# Patient Record
Sex: Female | Born: 1980 | Race: White | Hispanic: No | Smoking: Former smoker
Health system: Southern US, Community
[De-identification: ages and names within clinical notes are randomized; demographics above are authoritative.]

## PROBLEM LIST (undated history)

## (undated) DIAGNOSIS — Z98811 Dental restoration status: Secondary | ICD-10-CM

## (undated) DIAGNOSIS — S66802A Unspecified injury of other specified muscles, fascia and tendons at wrist and hand level, left hand, initial encounter: Secondary | ICD-10-CM

## (undated) DIAGNOSIS — R0981 Nasal congestion: Secondary | ICD-10-CM

## (undated) HISTORY — PX: AUGMENTATION MAMMAPLASTY: SUR837

---
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
## 2002-12-17 HISTORY — PX: LAPAROSCOPIC LYSIS OF ADHESIONS: SHX5905

## 2004-12-17 HISTORY — PX: LAPAROSCOPIC OVARIAN CYSTECTOMY: SHX6248

## 2013-09-16 DIAGNOSIS — S66802A Unspecified injury of other specified muscles, fascia and tendons at wrist and hand level, left hand, initial encounter: Secondary | ICD-10-CM

## 2013-09-16 HISTORY — DX: Unspecified injury of other specified muscles, fascia and tendons at wrist and hand level, left hand, initial encounter: S66.802A

## 2013-09-25 ENCOUNTER — Other Ambulatory Visit: Payer: Self-pay | Admitting: Orthopedic Surgery

## 2013-09-25 ENCOUNTER — Encounter (HOSPITAL_BASED_OUTPATIENT_CLINIC_OR_DEPARTMENT_OTHER): Payer: Self-pay | Admitting: *Deleted

## 2013-09-25 DIAGNOSIS — R0981 Nasal congestion: Secondary | ICD-10-CM

## 2013-09-25 HISTORY — DX: Nasal congestion: R09.81

## 2013-09-29 ENCOUNTER — Encounter (HOSPITAL_BASED_OUTPATIENT_CLINIC_OR_DEPARTMENT_OTHER): Payer: Self-pay | Admitting: Orthopedic Surgery

## 2013-09-29 ENCOUNTER — Encounter (HOSPITAL_BASED_OUTPATIENT_CLINIC_OR_DEPARTMENT_OTHER)
Admission: RE | Disposition: A | Discharge status: Home / Self Care | Payer: Self-pay | Source: Ambulatory Visit | Attending: Orthopedic Surgery

## 2013-09-29 ENCOUNTER — Encounter (HOSPITAL_BASED_OUTPATIENT_CLINIC_OR_DEPARTMENT_OTHER): Payer: Federal, State, Local not specified - PPO | Admitting: Certified Registered Nurse Anesthetist

## 2013-09-29 ENCOUNTER — Ambulatory Visit (HOSPITAL_BASED_OUTPATIENT_CLINIC_OR_DEPARTMENT_OTHER): Payer: Federal, State, Local not specified - PPO | Admitting: Certified Registered Nurse Anesthetist

## 2013-09-29 ENCOUNTER — Ambulatory Visit (HOSPITAL_BASED_OUTPATIENT_CLINIC_OR_DEPARTMENT_OTHER)
Admission: RE | Admit: 2013-09-29 | Discharge: 2013-09-29 | Disposition: A | Discharge status: Home / Self Care | Payer: Federal, State, Local not specified - PPO | Source: Ambulatory Visit | Attending: Orthopedic Surgery | Admitting: Orthopedic Surgery

## 2013-09-29 DIAGNOSIS — Y93K1 Activity, walking an animal: Secondary | ICD-10-CM | POA: Insufficient documentation

## 2013-09-29 DIAGNOSIS — S62639A Displaced fracture of distal phalanx of unspecified finger, initial encounter for closed fracture: Secondary | ICD-10-CM | POA: Insufficient documentation

## 2013-09-29 DIAGNOSIS — IMO0002 Reserved for concepts with insufficient information to code with codable children: Secondary | ICD-10-CM | POA: Insufficient documentation

## 2013-09-29 HISTORY — PX: TENDON REPAIR: SHX5111

## 2013-09-29 HISTORY — DX: Dental restoration status: Z98.811

## 2013-09-29 HISTORY — DX: Unspecified injury of other specified muscles, fascia and tendons at wrist and hand level, left hand, initial encounter: S66.802A

## 2013-09-29 HISTORY — DX: Nasal congestion: R09.81

## 2013-09-29 SURGERY — TENDON REPAIR
Anesthesia: General | Site: Finger | Laterality: Left | Wound class: Clean

## 2013-09-29 MED ORDER — BUPIVACAINE-EPINEPHRINE PF 0.5-1:200000 % IJ SOLN
INTRAMUSCULAR | Status: DC | PRN
  Administered 2013-09-29: 30 mL

## 2013-09-29 MED ORDER — BUPIVACAINE HCL (PF) 0.25 % IJ SOLN
INTRAMUSCULAR | Status: AC
  Filled 2013-09-29: qty 30

## 2013-09-29 MED ORDER — MEPERIDINE HCL 25 MG/ML IJ SOLN: 6.2500 mg | INTRAMUSCULAR | Status: DC | PRN

## 2013-09-29 MED ORDER — DEXAMETHASONE SODIUM PHOSPHATE 10 MG/ML IJ SOLN
INTRAMUSCULAR | Status: DC | PRN
  Administered 2013-09-29: 10 mg via INTRAVENOUS

## 2013-09-29 MED ORDER — OXYCODONE HCL 5 MG PO TABS: 5.0000 mg | ORAL_TABLET | Freq: Once | ORAL | Status: DC | PRN

## 2013-09-29 MED ORDER — CHLORHEXIDINE GLUCONATE 4 % EX LIQD: 60.0000 mL | Freq: Once | CUTANEOUS | Status: DC

## 2013-09-29 MED ORDER — ONDANSETRON HCL 4 MG/2ML IJ SOLN
INTRAMUSCULAR | Status: DC | PRN
  Administered 2013-09-29: 4 mg via INTRAMUSCULAR

## 2013-09-29 MED ORDER — DEXAMETHASONE SODIUM PHOSPHATE 4 MG/ML IJ SOLN
INTRAMUSCULAR | Status: DC | PRN
  Administered 2013-09-29: 4 mg

## 2013-09-29 MED ORDER — FENTANYL CITRATE 0.05 MG/ML IJ SOLN
50.0000 ug | INTRAMUSCULAR | Status: DC | PRN
  Administered 2013-09-29: 100 ug via INTRAVENOUS

## 2013-09-29 MED ORDER — HYDROMORPHONE HCL PF 1 MG/ML IJ SOLN: 0.2500 mg | INTRAMUSCULAR | Status: DC | PRN

## 2013-09-29 MED ORDER — ONDANSETRON HCL 4 MG/2ML IJ SOLN: 4.0000 mg | Freq: Once | INTRAMUSCULAR | Status: DC | PRN

## 2013-09-29 MED ORDER — CEFAZOLIN SODIUM-DEXTROSE 2-3 GM-% IV SOLR
INTRAVENOUS | Status: AC
  Filled 2013-09-29: qty 50

## 2013-09-29 MED ORDER — LACTATED RINGERS IV SOLN
INTRAVENOUS | Status: DC
  Administered 2013-09-29 (×2): via INTRAVENOUS

## 2013-09-29 MED ORDER — MIDAZOLAM HCL 5 MG/5ML IJ SOLN
INTRAMUSCULAR | Status: DC | PRN
  Administered 2013-09-29: 1 mg via INTRAVENOUS

## 2013-09-29 MED ORDER — CEFAZOLIN SODIUM-DEXTROSE 2-3 GM-% IV SOLR
2.0000 g | INTRAVENOUS | Status: AC
  Administered 2013-09-29: 2 g via INTRAVENOUS

## 2013-09-29 MED ORDER — OXYCODONE HCL 5 MG/5ML PO SOLN: 5.0000 mg | Freq: Once | ORAL | Status: DC | PRN

## 2013-09-29 MED ORDER — FENTANYL CITRATE 0.05 MG/ML IJ SOLN
INTRAMUSCULAR | Status: AC
  Filled 2013-09-29: qty 2

## 2013-09-29 MED ORDER — MIDAZOLAM HCL 2 MG/2ML IJ SOLN
INTRAMUSCULAR | Status: AC
  Filled 2013-09-29: qty 2

## 2013-09-29 MED ORDER — MIDAZOLAM HCL 2 MG/2ML IJ SOLN
1.0000 mg | INTRAMUSCULAR | Status: DC | PRN
  Administered 2013-09-29: 2 mg via INTRAVENOUS

## 2013-09-29 MED ORDER — 0.9 % SODIUM CHLORIDE (POUR BTL) OPTIME
TOPICAL | Status: DC | PRN
  Administered 2013-09-29: 200 mL

## 2013-09-29 MED ORDER — HYDROCODONE-ACETAMINOPHEN 5-500 MG PO TABS: 1.0000 | ORAL_TABLET | Freq: Four times a day (QID) | ORAL | Status: DC | PRN

## 2013-09-29 MED ORDER — LIDOCAINE HCL (CARDIAC) 20 MG/ML IV SOLN
INTRAVENOUS | Status: DC | PRN
  Administered 2013-09-29: 30 mg via INTRAVENOUS

## 2013-09-29 MED ORDER — PROPOFOL 10 MG/ML IV BOLUS
INTRAVENOUS | Status: DC | PRN
  Administered 2013-09-29: 150 mg via INTRAVENOUS

## 2013-09-29 MED ORDER — MIDAZOLAM HCL 2 MG/ML PO SYRP: 12.0000 mg | ORAL_SOLUTION | Freq: Once | ORAL | Status: DC | PRN

## 2013-09-29 SURGICAL SUPPLY — 79 items
BAG DECANTER FOR FLEXI CONT (MISCELLANEOUS) IMPLANT
BANDAGE GAUZE ELAST BULKY 4 IN (GAUZE/BANDAGES/DRESSINGS) ×2 IMPLANT
BLADE MINI RND TIP GREEN BEAV (BLADE) IMPLANT
BLADE SURG 15 STRL LF DISP TIS (BLADE) ×1 IMPLANT
BLADE SURG 15 STRL SS (BLADE) ×1
BNDG COHESIVE 3X5 TAN STRL LF (GAUZE/BANDAGES/DRESSINGS) ×2 IMPLANT
BNDG ESMARK 4X9 LF (GAUZE/BANDAGES/DRESSINGS) ×2 IMPLANT
CHLORAPREP W/TINT 26ML (MISCELLANEOUS) ×2 IMPLANT
CLOTH BEACON ORANGE TIMEOUT ST (SAFETY) IMPLANT
CORDS BIPOLAR (ELECTRODE) ×2 IMPLANT
COTTONBALL LRG STERILE PKG (GAUZE/BANDAGES/DRESSINGS) IMPLANT
COVER MAYO STAND STRL (DRAPES) ×2 IMPLANT
COVER TABLE BACK 60X90 (DRAPES) ×2 IMPLANT
CUFF TOURNIQUET SINGLE 18IN (TOURNIQUET CUFF) ×2 IMPLANT
DECANTER SPIKE VIAL GLASS SM (MISCELLANEOUS) IMPLANT
DRAIN TLS ROUND 10FR (DRAIN) IMPLANT
DRAPE EXTREMITY T 121X128X90 (DRAPE) ×2 IMPLANT
DRAPE OEC MINIVIEW 54X84 (DRAPES) ×2 IMPLANT
DRAPE SURG 17X23 STRL (DRAPES) ×2 IMPLANT
DRSG KUZMA FLUFF (GAUZE/BANDAGES/DRESSINGS) IMPLANT
GAUZE SPONGE 4X4 16PLY XRAY LF (GAUZE/BANDAGES/DRESSINGS) IMPLANT
GAUZE XEROFORM 1X8 LF (GAUZE/BANDAGES/DRESSINGS) ×2 IMPLANT
GLOVE BIOGEL PI IND STRL 7.0 (GLOVE) ×1 IMPLANT
GLOVE BIOGEL PI IND STRL 8.5 (GLOVE) ×1 IMPLANT
GLOVE BIOGEL PI INDICATOR 7.0 (GLOVE) ×1
GLOVE BIOGEL PI INDICATOR 8.5 (GLOVE) ×1
GLOVE ECLIPSE 6.5 STRL STRAW (GLOVE) ×2 IMPLANT
GLOVE EXAM NITRILE MD LF STRL (GLOVE) ×2 IMPLANT
GLOVE SURG ORTHO 8.0 STRL STRW (GLOVE) ×2 IMPLANT
GOWN BRE IMP PREV XXLGXLNG (GOWN DISPOSABLE) ×2 IMPLANT
GOWN PREVENTION PLUS XLARGE (GOWN DISPOSABLE) ×2 IMPLANT
K-WIRE .035X4 (WIRE) IMPLANT
K-WIRE PROS .028 4 (WIRE) ×2 IMPLANT
LOOP VESSEL MAXI BLUE (MISCELLANEOUS) IMPLANT
NEEDLE 27GAX1X1/2 (NEEDLE) IMPLANT
NEEDLE HYPO 22GX1.5 SAFETY (NEEDLE) IMPLANT
NEEDLE KEITH (NEEDLE) IMPLANT
NS IRRIG 1000ML POUR BTL (IV SOLUTION) ×2 IMPLANT
PACK BASIN DAY SURGERY FS (CUSTOM PROCEDURE TRAY) ×2 IMPLANT
PAD CAST 3X4 CTTN HI CHSV (CAST SUPPLIES) ×1 IMPLANT
PADDING CAST ABS 3INX4YD NS (CAST SUPPLIES)
PADDING CAST ABS 4INX4YD NS (CAST SUPPLIES)
PADDING CAST ABS COTTON 3X4 (CAST SUPPLIES) IMPLANT
PADDING CAST ABS COTTON 4X4 ST (CAST SUPPLIES) IMPLANT
PADDING CAST COTTON 3X4 STRL (CAST SUPPLIES) ×1
SLEEVE SCD COMPRESS KNEE MED (MISCELLANEOUS) ×2 IMPLANT
SLING ARM FOAM STRAP LRG (SOFTGOODS) ×2 IMPLANT
SPLINT PLASTER CAST XFAST 3X15 (CAST SUPPLIES) ×10 IMPLANT
SPLINT PLASTER XTRA FASTSET 3X (CAST SUPPLIES) ×10
SPONGE GAUZE 4X4 12PLY (GAUZE/BANDAGES/DRESSINGS) ×2 IMPLANT
STOCKINETTE 4X48 STRL (DRAPES) ×2 IMPLANT
SUT CHROMIC 5 0 P 3 (SUTURE) IMPLANT
SUT ETHIBOND 3-0 V-5 (SUTURE) IMPLANT
SUT FIBERWIRE 2-0 18 17.9 3/8 (SUTURE)
SUT FIBERWIRE 4-0 18 TAPR NDL (SUTURE)
SUT MERSILENE 2.0 SH NDLE (SUTURE) IMPLANT
SUT MERSILENE 3 0 FS 1 (SUTURE) IMPLANT
SUT MERSILENE 4 0 P 3 (SUTURE) IMPLANT
SUT POLY BUTTON 15MM (SUTURE) ×2 IMPLANT
SUT PROLENE 2 0 SH DA (SUTURE) ×2 IMPLANT
SUT SILK 2 0 FS (SUTURE) ×2 IMPLANT
SUT SILK 4 0 PS 2 (SUTURE) IMPLANT
SUT STEEL 3 0 (SUTURE) IMPLANT
SUT STEEL 4 0 V 26 (SUTURE) IMPLANT
SUT VIC AB 3-0 PS1 18 (SUTURE)
SUT VIC AB 3-0 PS1 18XBRD (SUTURE) IMPLANT
SUT VIC AB 4-0 P-3 18XBRD (SUTURE) IMPLANT
SUT VIC AB 4-0 P2 18 (SUTURE) ×2 IMPLANT
SUT VIC AB 4-0 P3 18 (SUTURE)
SUT VICRYL 4-0 PS2 18IN ABS (SUTURE) IMPLANT
SUT VICRYL RAPID 5 0 P 3 (SUTURE) IMPLANT
SUT VICRYL RAPIDE 4/0 PS 2 (SUTURE) ×2 IMPLANT
SUTURE FIBERWR 2-0 18 17.9 3/8 (SUTURE) IMPLANT
SUTURE FIBERWR 4-0 18 TAPR NDL (SUTURE) IMPLANT
SYR BULB 3OZ (MISCELLANEOUS) ×2 IMPLANT
SYR CONTROL 10ML LL (SYRINGE) IMPLANT
TOWEL OR 17X24 6PK STRL BLUE (TOWEL DISPOSABLE) ×4 IMPLANT
TUBE FEEDING 5FR 15 INCH (TUBING) IMPLANT
UNDERPAD 30X30 INCONTINENT (UNDERPADS AND DIAPERS) ×2 IMPLANT

## 2013-09-29 NOTE — Progress Notes (Signed)
Assisted Dr. Ossey with left, ultrasound guided, supraclavicular block. Side rails up, monitors on throughout procedure. See vital signs in flow sheet. Tolerated Procedure well. 

## 2013-09-29 NOTE — H&P (Signed)
  Emily Rogers is a 32 year old right hand dominant female with an injury to her left ring finger. This occurred when the finger was caught in a dog harness on 09-12-13. She did not seek medical attention. She went to [facility] on 09-23-13 where x-rays were taken revealing a fracture of his distal phalanx volar aspect with proximal migration. She has no prior history of injuries. No history of diabetes, thyroid problems, arthritis or gout.   PAST MEDICAL HISTORY: She has no known drug allergies. She is on Mucinex D. She has had an appendectomy, laparoscopy adhesion removal, ovarian cyst and adhesion removal in 2006.  FAMILY H ISTORY: Negative.  SOCIAL HISTORY: She does not smoke. She drinks socially. She is married and an Database administrator.   REVIEW OF SYSTEMS: Positive for contacts, glasses, otherwise negative for 14 points. Emily Rogers is an 32 y.o. female.   Chief Complaint: Flexor tendon avulsion lrf HPI: see above  Past Medical History  Diagnosis Date  . Injury of flexor tendon of left hand 09/2013    ring finger  . Nasal congestion 09/25/2013  . Dental crowns present     x 3    Past Surgical History  Procedure Laterality Date  . Appendectomy  1988  . Laparoscopic lysis of adhesions  2004  . Laparoscopic ovarian cystectomy  2006    with lysis of adhesions    History reviewed. No pertinent family history. Social History:  reports that she has never smoked. She has never used smokeless tobacco. She reports that she drinks alcohol. She reports that she does not use illicit drugs.  Allergies: No Known Allergies  No prescriptions prior to admission    No results found for this or any previous visit (from the past 48 hour(s)).  No results found.   Pertinent items are noted in HPI.  Height 5\' 8"  (1.727 m), weight 127 lb (57.607 kg), last menstrual period 09/21/2013.  General appearance: alert, cooperative and appears stated age Head: Normocephalic, without  obvious abnormality, atraumatic Neck: no JVD Resp: clear to auscultation bilaterally Cardio: regular rate and rhythm, S1, S2 normal, no murmur, click, rub or gallop GI: soft, non-tender; bowel sounds normal; no masses,  no organomegaly Extremities: extremities normal, atraumatic, no cyanosis or edema Pulses: 2+ and symmetric Skin: Skin color, texture, turgor normal. No rashes or lesions Neurologic: Grossly normal Incision/Wound: na  Assessment/Plan X-rays reveal an avulsion of the volar half of the distal phalanx attachment of the flexor digitorum profundus.  We have discussed this with her and her husband. We discussed that this is a flexor tendon avulsion injury and the tendon may actually not be attached to the fracture fragment. This is going to need to be repaired as an outpatient. The pre, peri and post op course are discussed along with risks and complications. They are aware there is no guarantee with surgery, possibility of infection, recurrence, injury to arteries, nerves and tendons, incomplete relief of symptoms, loss of mobility, stiffness and dystrophy. She has elected to proceed. She is scheduled for ORIF repair flexor tendon left ring finger as an outpatient under regional anesthesia.  Emily Rogers R 09/29/2013, 10:09 AM

## 2013-09-29 NOTE — Op Note (Signed)
Dictation Number [id number]

## 2013-09-29 NOTE — Transfer of Care (Signed)
Immediate Anesthesia Transfer of Care Note  Patient: Emily Rogers  Procedure(s) Performed: Procedure(s): REPAIR FLEXOR TENDON LEFT RING FINGER (Left)  Patient Location: PACU  Anesthesia Type:GA combined with regional for post-op pain  Level of Consciousness: awake, alert , oriented and patient cooperative  Airway & Oxygen Therapy: Patient Spontanous Breathing and Patient connected to face mask oxygen  Post-op Assessment: Report given to PACU RN and Post -op Vital signs reviewed and stable  Post vital signs: Reviewed and stable  Complications: No apparent anesthesia complications

## 2013-09-29 NOTE — Anesthesia Procedure Notes (Addendum)
Anesthesia Regional Block:  Supraclavicular block  Pre-Anesthetic Checklist: ,, timeout performed, Correct Patient, Correct Site, Correct Laterality, Correct Procedure, Correct Position, site marked, Risks and benefits discussed,  Surgical consent,  Pre-op evaluation,  At surgeon's request and post-op pain management  Laterality: Left  Prep: chloraprep       Needles:   Needle Type: Echogenic Stimulator Needle     Needle Length: 5cm 5 cm Needle Gauge: 21 and 21 G    Additional Needles:  Procedures: ultrasound guided (picture in chart) and nerve stimulator Supraclavicular block  Nerve Stimulator or Paresthesia:  Response: 0.4 mA,   Additional Responses:   Narrative:  Start time: 09/29/2013 12:00 PM End time: 09/29/2013 12:10 PM Injection made incrementally with aspirations every 5 mL.  Performed by: Personally  Anesthesiologist: Arta Bruce MD  Additional Notes: Monitors applied. Patient sedated. Sterile prep and drape,hand hygiene and sterile gloves were used. Relevant anatomy identified.Needle position confirmed.Local anesthetic injected incrementally after negative aspiration. Local anesthetic spread visualized around nerve(s). Vascular puncture avoided. No complications. Image printed for medical record.The patient tolerated the procedure well.       Supraclavicular block Procedure Name: LMA Insertion Date/Time: 09/29/2013 1:16 PM Performed by: Cindee Salt R Pre-anesthesia Checklist: Patient identified, Emergency Drugs available, Suction available and Patient being monitored Patient Re-evaluated:Patient Re-evaluated prior to inductionOxygen Delivery Method: Circle System Utilized Preoxygenation: Pre-oxygenation with 100% oxygen Intubation Type: IV induction Ventilation: Mask ventilation without difficulty LMA: LMA inserted LMA Size: 4.0 Number of attempts: 1 Airway Equipment and Method: bite block Placement Confirmation: positive ETCO2 Tube secured with:  Tape Dental Injury: Teeth and Oropharynx as per pre-operative assessment

## 2013-09-29 NOTE — Progress Notes (Signed)
Pt vagaled  With IV insertion B/P and HR dropped, pt remained awake and alert Wet wash cloth applied to forehead IV fluids allowed to run in  Freely Pt states " I'm feeling better after 15 min

## 2013-09-29 NOTE — Anesthesia Preprocedure Evaluation (Addendum)
Anesthesia Evaluation  Patient identified by MRN, date of birth, ID band Patient awake    Reviewed: Allergy & Precautions, H&P , NPO status , Patient's Chart, lab work & pertinent test results  Airway Mallampati: I  TM Distance: >3 FB Neck ROM: Full    Dental   Pulmonary          Cardiovascular     Neuro/Psych    GI/Hepatic   Endo/Other    Renal/GU      Musculoskeletal   Abdominal   Peds  Hematology   Anesthesia Other Findings   Reproductive/Obstetrics                             Anesthesia Physical Anesthesia Plan  ASA: II  Anesthesia Plan: General   Post-op Pain Management:    Induction: Intravenous  Airway Management Planned: LMA  Additional Equipment:   Intra-op Plan:   Post-operative Plan: Extubation in OR  Informed Consent:   Plan Discussed with:   Anesthesia Plan Comments:         Anesthesia Quick Evaluation  

## 2013-09-29 NOTE — Brief Op Note (Signed)
09/29/2013  2:19 PM  PATIENT:  [name]  32 y.o. female  PRE-OPERATIVE DIAGNOSIS:  AVULSION FLEXOR LEFT RING FINGER  POST-OPERATIVE DIAGNOSIS:  AVULSION FLEXOR LEFT RING FINGER  PROCEDURE:  Procedure(s): REPAIR FLEXOR TENDON LEFT RING FINGER (Left)  SURGEON:  Surgeon(s) and Role:    * Nicki Reaper, MD - Primary  PHYSICIAN ASSISTANT:   ASSISTANTS: none   ANESTHESIA:   regional and general  EBL:  Total I/O In: 1500 [I.V.:1500] Out: -   BLOOD ADMINISTERED:none  DRAINS: none   LOCAL MEDICATIONS USED:  NONE  SPECIMEN:  No Specimen  DISPOSITION OF SPECIMEN:  N/A  COUNTS:  YES  TOURNIQUET:   Total Tourniquet Time Documented: Upper Arm (Left) - 47 minutes Total: Upper Arm (Left) - 47 minutes   DICTATION: .Other Dictation: Dictation Number [id number]  PLAN OF CARE: Discharge to home after PACU  PATIENT DISPOSITION:  PACU - hemodynamically stable.

## 2013-09-29 NOTE — Anesthesia Postprocedure Evaluation (Signed)
Anesthesia Post Note  Patient: Emily Rogers  Procedure(s) Performed: Procedure(s) (LRB): REPAIR FLEXOR TENDON LEFT RING FINGER (Left)  Anesthesia type: general  Patient location: PACU  Post pain: Pain level controlled  Post assessment: Patient's Cardiovascular Status Stable  Last Vitals:  Filed Vitals:   09/29/13 1445  BP: 124/83  Pulse: 80  Temp:   Resp: 16    Post vital signs: Reviewed and stable  Level of consciousness: sedated  Complications: No apparent anesthesia complications

## 2013-09-30 NOTE — Op Note (Signed)
NAMEMARYBELLE, Emily Rogers                  ACCOUNT NO.:  [id number]  MEDICAL RECORD NO.:  [id number]  LOCATION:                                 FACILITY:  PHYSICIAN:  Cindee Salt, M.D.            DATE OF BIRTH:  DATE OF PROCEDURE:  09/29/2013 DATE OF DISCHARGE:                              OPERATIVE REPORT   PREOPERATIVE DIAGNOSIS:  Avulsion profundus tendon, left ring finger.  POSTOPERATIVE DIAGNOSIS:  Avulsion profundus tendon, left ring finger.  OPERATION:  Open reduction and internal fixation distal phalanx fracture with reattachment of profundus tendon, left ring finger.  SURGEON:  Cindee Salt, M.D.  ANESTHESIA:  Supraclavicular block general.  ANESTHESIOLOGIST:  Kaylyn Layer. Michelle Piper, MD.  HISTORY:  The patient is a 32 year old female who suffered an injury to her left ring finger when it was caught in a dog leash.  X-rays reveal a fracture of the distal phalanx with a avulsion profundus tendon, inability to flex.  Circulation and sensation were intact.  She is admitted for open reduction and internal fixation with repair of flexor tendon.  Pre, peri, and postoperative course have been discussed along with risks and complications.  She is aware that there is no guarantee with surgery; possibility of infection; recurrence of injury to arteries, nerves, tendons, incomplete relief of symptoms, dystrophy.  In the preoperative area, the patient is seen, the extremity marked by both patient and surgeon.  Antibiotic given.  PROCEDURE:  The patient was brought to the operating room, where a supraclavicular block general anesthetic under the direction of Dr. Michelle Piper was performed.  She was prepped using ChloraPrep, supine in position with the left arm free.  A 3-minute dry time was allowed.  Time- out taken, confirming patient and procedure.  The limb was exsanguinated with an Esmarch bandage.  Tourniquet placed high and the arm was inflated to 250 mmHg.  A midlateral incision was made on the  ulnar aspect of the ring finger, carried across the pulp, carried down through subcutaneous tissue.  Bleeders were electrocauterized with bipolar.  The neurovascular bundle was identified.  This was traced distally.  The profundus tendon was found to be avulsed.  The periosteum had been not fully removed distally.  The bone fragment being pulled from beneath this was incised taking care to protect the neurovascular bundle both radially and ulnarly.  The area was then debrided.  A dorsal fracture was also noted.  It was decided that this would require pinning for stabilization.  A modified Bunnell suture using 2-0 Prolene was then passed.  The volar fracture was in 2-3 pieces that was comminuted and as such, would not hold a screws.  The suture was placed through the fracture fragments to allow the articular surface be realigned without regard to the exact position of the more distal fragments.  The K-wire was then placed.  This was two 8 in size.  This stabilized the articular surface.  The profundus tendon was then reinserted and tied over a button after passing the needles through the dorsal cortex out through the nail matrix and nail plate distally.  The wound was copiously irrigated  with saline.  X-rays confirmed positioning of the joint in good position.  The fracture fragments at the termination of the profundus tendon were still slightly prominent, but this was felt to be secondary to pulling the tendon into the fracture defect.  The skin was closed with interrupted 4-0 Vicryl Rapide sutures.  The pin was bent, cut short.  Sterile compressive dressing and splint applied with the fingers in a slightly flexed position.  The patient tolerated the procedure well.  On deflation of the tourniquet, she was taken to the recovery room for observation in satisfactory condition, fingers immediately turned pink.  On deflation of the tourniquet, she will be discharged home to return in 1 week  on Vicodin.          ______________________________ Cindee Salt, M.D.     GK/MEDQ  D:  09/29/2013  T:  09/30/2013  Job:  [id number]

## 2013-10-02 ENCOUNTER — Encounter (HOSPITAL_BASED_OUTPATIENT_CLINIC_OR_DEPARTMENT_OTHER): Payer: Self-pay | Admitting: Orthopedic Surgery

## 2015-01-03 ENCOUNTER — Inpatient Hospital Stay (HOSPITAL_COMMUNITY)
Admission: AD | Admit: 2015-01-03 | Payer: Federal, State, Local not specified - PPO | Source: Ambulatory Visit | Admitting: Obstetrics

## 2015-05-04 ENCOUNTER — Inpatient Hospital Stay (HOSPITAL_COMMUNITY)
Admission: AD | Admit: 2015-05-04 | Discharge: 2015-05-07 | DRG: 766 | Disposition: A | Discharge status: Home / Self Care | Payer: Federal, State, Local not specified - PPO | Source: Ambulatory Visit | Attending: Obstetrics and Gynecology | Admitting: Obstetrics and Gynecology

## 2015-05-04 ENCOUNTER — Inpatient Hospital Stay (HOSPITAL_COMMUNITY): Payer: Federal, State, Local not specified - PPO | Admitting: Anesthesiology

## 2015-05-04 ENCOUNTER — Encounter (HOSPITAL_COMMUNITY)
Admission: AD | Disposition: A | Discharge status: Home / Self Care | Payer: Self-pay | Source: Ambulatory Visit | Attending: Obstetrics and Gynecology

## 2015-05-04 ENCOUNTER — Encounter (HOSPITAL_COMMUNITY): Payer: Self-pay | Admitting: *Deleted

## 2015-05-04 DIAGNOSIS — O133 Gestational [pregnancy-induced] hypertension without significant proteinuria, third trimester: Secondary | ICD-10-CM | POA: Diagnosis present

## 2015-05-04 DIAGNOSIS — O321XX0 Maternal care for breech presentation, not applicable or unspecified: Secondary | ICD-10-CM | POA: Diagnosis present

## 2015-05-04 DIAGNOSIS — O09513 Supervision of elderly primigravida, third trimester: Secondary | ICD-10-CM | POA: Diagnosis not present

## 2015-05-04 DIAGNOSIS — Z3A38 38 weeks gestation of pregnancy: Secondary | ICD-10-CM | POA: Diagnosis present

## 2015-05-04 DIAGNOSIS — Z3403 Encounter for supervision of normal first pregnancy, third trimester: Secondary | ICD-10-CM | POA: Diagnosis present

## 2015-05-04 DIAGNOSIS — Z98891 History of uterine scar from previous surgery: Secondary | ICD-10-CM

## 2015-05-04 HISTORY — PX: TUBAL LIGATION: SHX77

## 2015-05-04 LAB — COMPREHENSIVE METABOLIC PANEL
ALBUMIN: 2.9 g/dL — AB (ref 3.5–5.0)
ALT: 24 U/L (ref 14–54)
AST: 26 U/L (ref 15–41)
Alkaline Phosphatase: 82 U/L (ref 38–126)
Anion gap: 9 (ref 5–15)
BUN: 10 mg/dL (ref 6–20)
CALCIUM: 9 mg/dL (ref 8.9–10.3)
CO2: 23 mmol/L (ref 22–32)
Chloride: 104 mmol/L (ref 101–111)
Creatinine, Ser: 0.67 mg/dL (ref 0.44–1.00)
GFR calc Af Amer: >60 mL/min (ref >60)
GFR calc non Af Amer: >60 mL/min (ref >60)
Glucose, Bld: 90 mg/dL (ref 65–99)
POTASSIUM: 3.9 mmol/L (ref 3.5–5.1)
SODIUM: 136 mmol/L (ref 135–145)
Total Bilirubin: 0.2 mg/dL — ABNORMAL LOW (ref 0.3–1.2)
Total Protein: 6.1 g/dL — ABNORMAL LOW (ref 6.5–8.1)

## 2015-05-04 LAB — PROTEIN / CREATININE RATIO, URINE
CREATININE, URINE: 186 mg/dL
Protein Creatinine Ratio: 0.26 mg/mg{Cre} — ABNORMAL HIGH (ref 0.00–0.15)
TOTAL PROTEIN, URINE: 49 mg/dL

## 2015-05-04 LAB — URINALYSIS, ROUTINE W REFLEX MICROSCOPIC
Bilirubin Urine: NEGATIVE
GLUCOSE, UA: NEGATIVE mg/dL
HGB URINE DIPSTICK: NEGATIVE
KETONES UR: NEGATIVE mg/dL
Leukocytes, UA: NEGATIVE
Nitrite: NEGATIVE
Protein, ur: 30 mg/dL — AB
Specific Gravity, Urine: >1.03 — ABNORMAL HIGH (ref 1.005–1.030)
Urobilinogen, UA: 0.2 mg/dL (ref 0.0–1.0)
pH: 6 (ref 5.0–8.0)

## 2015-05-04 LAB — CBC
HCT: 33.8 % — ABNORMAL LOW (ref 36.0–46.0)
Hemoglobin: 11.7 g/dL — ABNORMAL LOW (ref 12.0–15.0)
MCH: 31.5 pg (ref 26.0–34.0)
MCHC: 34.6 g/dL (ref 30.0–36.0)
MCV: 90.9 fL (ref 78.0–100.0)
PLATELETS: 137 10*3/uL — AB (ref 150–400)
RBC: 3.72 MIL/uL — AB (ref 3.87–5.11)
RDW: 13.5 % (ref 11.5–15.5)
WBC: 12.1 10*3/uL — AB (ref 4.0–10.5)

## 2015-05-04 LAB — URINE MICROSCOPIC-ADD ON

## 2015-05-04 LAB — LACTATE DEHYDROGENASE: LDH: 130 U/L (ref 98–192)

## 2015-05-04 LAB — TYPE AND SCREEN
ABO/RH(D): O POS
Antibody Screen: NEGATIVE

## 2015-05-04 LAB — URIC ACID: Uric Acid, Serum: 6.2 mg/dL (ref 2.3–6.6)

## 2015-05-04 SURGERY — Surgical Case
Anesthesia: Spinal | Site: Abdomen

## 2015-05-04 MED ORDER — NALBUPHINE HCL 10 MG/ML IJ SOLN: 5.0000 mg | INTRAMUSCULAR | Status: DC | PRN

## 2015-05-04 MED ORDER — FENTANYL CITRATE (PF) 100 MCG/2ML IJ SOLN: 25.0000 ug | INTRAMUSCULAR | Status: DC | PRN

## 2015-05-04 MED ORDER — DIPHENHYDRAMINE HCL 50 MG/ML IJ SOLN: 12.5000 mg | INTRAMUSCULAR | Status: DC | PRN

## 2015-05-04 MED ORDER — LACTATED RINGERS IV SOLN
INTRAVENOUS | Status: DC
  Administered 2015-05-04: 19:00:00 via INTRAVENOUS

## 2015-05-04 MED ORDER — FENTANYL CITRATE (PF) 100 MCG/2ML IJ SOLN
INTRAMUSCULAR | Status: AC
  Filled 2015-05-04: qty 2

## 2015-05-04 MED ORDER — ONDANSETRON HCL 4 MG/2ML IJ SOLN: 4.0000 mg | Freq: Three times a day (TID) | INTRAMUSCULAR | Status: DC | PRN

## 2015-05-04 MED ORDER — NALOXONE HCL 1 MG/ML IJ SOLN
1.0000 ug/kg/h | INTRAVENOUS | Status: DC | PRN
  Filled 2015-05-04: qty 2

## 2015-05-04 MED ORDER — SCOPOLAMINE 1 MG/3DAYS TD PT72
MEDICATED_PATCH | TRANSDERMAL | Status: DC | PRN
  Administered 2015-05-04: 1 via TRANSDERMAL

## 2015-05-04 MED ORDER — MORPHINE SULFATE 0.5 MG/ML IJ SOLN
INTRAMUSCULAR | Status: AC
  Filled 2015-05-04: qty 10

## 2015-05-04 MED ORDER — BUPIVACAINE IN DEXTROSE 0.75-8.25 % IT SOLN
INTRATHECAL | Status: DC | PRN
  Administered 2015-05-04: 1.5 mg via INTRATHECAL

## 2015-05-04 MED ORDER — MORPHINE SULFATE (PF) 0.5 MG/ML IJ SOLN
INTRAMUSCULAR | Status: DC | PRN
  Administered 2015-05-04: .1 mg via INTRATHECAL

## 2015-05-04 MED ORDER — LACTATED RINGERS IV SOLN: INTRAVENOUS | Status: DC

## 2015-05-04 MED ORDER — MEPERIDINE HCL 25 MG/ML IJ SOLN: 6.2500 mg | INTRAMUSCULAR | Status: DC | PRN

## 2015-05-04 MED ORDER — KETOROLAC TROMETHAMINE 30 MG/ML IJ SOLN: 30.0000 mg | Freq: Four times a day (QID) | INTRAMUSCULAR | Status: DC | PRN

## 2015-05-04 MED ORDER — LACTATED RINGERS IV SOLN
INTRAVENOUS | Status: DC
  Administered 2015-05-04 (×2): via INTRAVENOUS

## 2015-05-04 MED ORDER — ONDANSETRON HCL 4 MG/2ML IJ SOLN
INTRAMUSCULAR | Status: DC | PRN
  Administered 2015-05-04: 4 mg via INTRAVENOUS

## 2015-05-04 MED ORDER — CITRIC ACID-SODIUM CITRATE 334-500 MG/5ML PO SOLN
30.0000 mL | Freq: Once | ORAL | Status: AC
  Administered 2015-05-04: 30 mL via ORAL
  Filled 2015-05-04: qty 15

## 2015-05-04 MED ORDER — ONDANSETRON HCL 4 MG/2ML IJ SOLN
INTRAMUSCULAR | Status: AC
  Filled 2015-05-04: qty 2

## 2015-05-04 MED ORDER — OXYTOCIN 10 UNIT/ML IJ SOLN
INTRAMUSCULAR | Status: AC
  Filled 2015-05-04: qty 4

## 2015-05-04 MED ORDER — SCOPOLAMINE 1 MG/3DAYS TD PT72
MEDICATED_PATCH | TRANSDERMAL | Status: AC
  Filled 2015-05-04: qty 1

## 2015-05-04 MED ORDER — SCOPOLAMINE 1 MG/3DAYS TD PT72
1.0000 | MEDICATED_PATCH | Freq: Once | TRANSDERMAL | Status: DC
Start: 2015-05-04 — End: 2015-05-07
  Filled 2015-05-04: qty 1

## 2015-05-04 MED ORDER — NALBUPHINE HCL 10 MG/ML IJ SOLN
5.0000 mg | INTRAMUSCULAR | Status: DC | PRN
  Administered 2015-05-04 – 2015-05-05 (×2): 5 mg via INTRAVENOUS
  Filled 2015-05-04 (×2): qty 1

## 2015-05-04 MED ORDER — FENTANYL CITRATE (PF) 100 MCG/2ML IJ SOLN
INTRAMUSCULAR | Status: DC | PRN
  Administered 2015-05-04: 12.5 ug via INTRATHECAL

## 2015-05-04 MED ORDER — OXYTOCIN 10 UNIT/ML IJ SOLN
40.0000 [IU] | INTRAVENOUS | Status: DC | PRN
  Administered 2015-05-04: 40 [IU] via INTRAVENOUS

## 2015-05-04 MED ORDER — FAMOTIDINE IN NACL 20-0.9 MG/50ML-% IV SOLN
20.0000 mg | Freq: Once | INTRAVENOUS | Status: AC
  Administered 2015-05-04: 20 mg via INTRAVENOUS
  Filled 2015-05-04: qty 50

## 2015-05-04 MED ORDER — BUPIVACAINE HCL 0.25 % IJ SOLN
INTRAMUSCULAR | Status: DC | PRN
  Administered 2015-05-04: 30 mL

## 2015-05-04 MED ORDER — CEFAZOLIN SODIUM-DEXTROSE 2-3 GM-% IV SOLR: 2.0000 g | INTRAVENOUS | Status: DC

## 2015-05-04 MED ORDER — BUPIVACAINE LIPOSOME 1.3 % IJ SUSP
INTRAMUSCULAR | Status: DC | PRN
  Administered 2015-05-04: 20 mL

## 2015-05-04 MED ORDER — NALBUPHINE HCL 10 MG/ML IJ SOLN
5.0000 mg | Freq: Once | INTRAMUSCULAR | Status: AC | PRN
  Administered 2015-05-04: 5 mg via INTRAVENOUS

## 2015-05-04 MED ORDER — SODIUM CHLORIDE 0.9 % IJ SOLN: 3.0000 mL | INTRAMUSCULAR | Status: DC | PRN

## 2015-05-04 MED ORDER — BUPIVACAINE LIPOSOME 1.3 % IJ SUSP
20.0000 mL | Freq: Once | INTRAMUSCULAR | Status: DC
  Filled 2015-05-04: qty 20

## 2015-05-04 MED ORDER — NALBUPHINE HCL 10 MG/ML IJ SOLN: 5.0000 mg | Freq: Once | INTRAMUSCULAR | Status: AC | PRN

## 2015-05-04 MED ORDER — PHENYLEPHRINE 8 MG IN D5W 100 ML (0.08MG/ML) PREMIX OPTIME
INJECTION | INTRAVENOUS | Status: AC
  Filled 2015-05-04: qty 100

## 2015-05-04 MED ORDER — NALOXONE HCL 0.4 MG/ML IJ SOLN: 0.4000 mg | INTRAMUSCULAR | Status: DC | PRN

## 2015-05-04 MED ORDER — CEFAZOLIN SODIUM-DEXTROSE 2-3 GM-% IV SOLR
2.0000 g | Freq: Once | INTRAVENOUS | Status: AC
  Administered 2015-05-04: 2 g via INTRAVENOUS
  Filled 2015-05-04: qty 50

## 2015-05-04 MED ORDER — DIPHENHYDRAMINE HCL 25 MG PO CAPS
25.0000 mg | ORAL_CAPSULE | ORAL | Status: DC | PRN
  Administered 2015-05-05: 25 mg via ORAL
  Filled 2015-05-04: qty 1

## 2015-05-04 SURGICAL SUPPLY — 30 items
CLAMP CORD UMBIL (MISCELLANEOUS) IMPLANT
CLIP FILSHIE TUBAL LIGA STRL (Clip) ×4 IMPLANT
CLOTH BEACON ORANGE TIMEOUT ST (SAFETY) ×4 IMPLANT
DRAPE SHEET LG 3/4 BI-LAMINATE (DRAPES) IMPLANT
DRSG OPSITE POSTOP 4X10 (GAUZE/BANDAGES/DRESSINGS) ×4 IMPLANT
DURAPREP 26ML APPLICATOR (WOUND CARE) ×4 IMPLANT
ELECT REM PT RETURN 9FT ADLT (ELECTROSURGICAL) ×4
ELECTRODE REM PT RTRN 9FT ADLT (ELECTROSURGICAL) ×2 IMPLANT
EXTRACTOR VACUUM M CUP 4 TUBE (SUCTIONS) IMPLANT
EXTRACTOR VACUUM M CUP 4' TUBE (SUCTIONS)
GLOVE BIO SURGEON STRL SZ7 (GLOVE) ×4 IMPLANT
GOWN STRL REUS W/TWL LRG LVL3 (GOWN DISPOSABLE) ×8 IMPLANT
KIT ABG SYR 3ML LUER SLIP (SYRINGE) IMPLANT
LIQUID BAND (GAUZE/BANDAGES/DRESSINGS) ×4 IMPLANT
NEEDLE HYPO 22GX1.5 SAFETY (NEEDLE) ×4 IMPLANT
NEEDLE HYPO 25X5/8 SAFETYGLIDE (NEEDLE) IMPLANT
NS IRRIG 1000ML POUR BTL (IV SOLUTION) ×4 IMPLANT
PACK C SECTION WH (CUSTOM PROCEDURE TRAY) ×4 IMPLANT
PAD OB MATERNITY 4.3X12.25 (PERSONAL CARE ITEMS) ×4 IMPLANT
RTRCTR C-SECT PINK 25CM LRG (MISCELLANEOUS) ×4 IMPLANT
STAPLER VISISTAT 35W (STAPLE) IMPLANT
SUT CHROMIC 1 CTX 36 (SUTURE) ×8 IMPLANT
SUT CHROMIC 2 0 CT 1 (SUTURE) ×4 IMPLANT
SUT PDS AB 0 CTX 60 (SUTURE) ×4 IMPLANT
SUT VIC AB 2-0 CT1 27 (SUTURE) ×2
SUT VIC AB 2-0 CT1 TAPERPNT 27 (SUTURE) ×2 IMPLANT
SUT VIC AB 4-0 KS 27 (SUTURE) IMPLANT
SYR 30ML LL (SYRINGE) ×4 IMPLANT
TOWEL OR 17X24 6PK STRL BLUE (TOWEL DISPOSABLE) ×4 IMPLANT
TRAY FOLEY CATH SILVER 14FR (SET/KITS/TRAYS/PACK) ×4 IMPLANT

## 2015-05-04 NOTE — MAU Note (Signed)
Urine in lab 

## 2015-05-04 NOTE — MAU Note (Signed)
Dr Malen GauzeFoster, BS circulator and Okey Regalarol [facility] notified of pending C/S. Plan is 'to follow' case going now.

## 2015-05-04 NOTE — Anesthesia Preprocedure Evaluation (Signed)
Anesthesia Evaluation  Patient identified by MRN, date of birth, ID band Patient awake    Reviewed: Allergy & Precautions, NPO status , Patient's Chart, lab work & pertinent test results  Airway Mallampati: II  TM Distance: >3 FB Neck ROM: Full    Dental no notable dental hx.    Pulmonary neg pulmonary ROS,  breath sounds clear to auscultation  Pulmonary exam normal       Cardiovascular negative cardio ROS Normal cardiovascular examRhythm:Regular Rate:Normal     Neuro/Psych negative neurological ROS  negative psych ROS   GI/Hepatic negative GI ROS, Neg liver ROS,   Endo/Other  negative endocrine ROS  Renal/GU negative Renal ROS  negative genitourinary   Musculoskeletal negative musculoskeletal ROS (+)   Abdominal   Peds negative pediatric ROS (+)  Hematology negative hematology ROS (+)   Anesthesia Other Findings   Reproductive/Obstetrics (+) Pregnancy                             Anesthesia Physical Anesthesia Plan  ASA: II  Anesthesia Plan: Spinal   Post-op Pain Management:    Induction:   Airway Management Planned: Natural Airway  Additional Equipment:   Intra-op Plan:   Post-operative Plan:   Informed Consent: I have reviewed the patients History and Physical, chart, labs and discussed the procedure including the risks, benefits and alternatives for the proposed anesthesia with the patient or authorized representative who has indicated his/her understanding and acceptance.   Dental advisory given  Plan Discussed with: CRNA  Anesthesia Plan Comments:         Anesthesia Quick Evaluation  

## 2015-05-04 NOTE — MAU Note (Signed)
Sent from office with BP elevation (170/90?)- first occurrence. Neg protein in urine. Denies HA, visual changes, epigastric pain or swelling.

## 2015-05-04 NOTE — Anesthesia Postprocedure Evaluation (Signed)
  Anesthesia Post-op Note  Patient: Emily SevinSarah Rogers  Procedure(s) Performed: Procedure(s) (LRB): CESAREAN SECTION (N/A) BILATERAL TUBAL LIGATION  Patient Location: PACU  Anesthesia Type: Spinal  Level of Consciousness: awake and alert   Airway and Oxygen Therapy: Patient Spontanous Breathing  Post-op Pain: mild  Post-op Assessment: Post-op Vital signs reviewed, Patient's Cardiovascular Status Stable, Respiratory Function Stable, Patent Airway and No signs of Nausea or vomiting  Last Vitals:  Filed Vitals:   05/04/15 2115  BP: 155/90  Pulse: 82  Temp:   Resp: 21    Post-op Vital Signs: stable   Complications: No apparent anesthesia complications

## 2015-05-04 NOTE — Op Note (Signed)
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 Waynard ReedsKendra Clarine Rogers Assistant:none Operative Findings: vigorous female infant in the frank breech presentation with Apgars of 8 and 9. The patient was noted to having narrow pelvis.  Adhesive disease involving the right tube and ovary to the colon. No significant lysis of adhesions was attempted. The distal fimbriated end of the left tube was normal-appearing, the isthmic portion of the fallopian tube was identified however the ampullary portion was densely adherent to the colon and could not be continuously followed to the fimbriated end. The right fallopian tube could be followed out to the fimbriated end. Filshie clips were applied to the isthmic portion of the fallopian tubes bilaterally. The estimated blood loss at this case was lower than normally experienced at cesarean Specimen: placenta for disposal EBL: Total I/O In: 800 [I.V.:800] Out: 500 [Urine:200; Blood:300]   Procedure:Ms. Emily Rogers is an 34 year old gravida 1 para 0 at 1938 weeks and 1 days estimated gestational age who presents for cesarean section. The patient was seen in the office today for routine antenatal visit and was noted to have elevated blood pressures. Further evaluation demonstrated mild range blood pressures consistent with gestational hypertension. An ultrasound was performed in the office today which demonstrated frank breech presentation. Additionally, the patient desired permanent sterilization. Following the appropriate informed consent the patient was brought to the operating room where spinal anesthesia was administered and found to be adequate. She was  placed in the dorsal supine position with a leftward tilt. She was prepped and draped in the normal sterile fashion. Scalpel was then used to make a Pfannenstiel skin incision which was carried down to the underlying layers of soft tissue to the fascia. The fascia was incised in the midline and the fascial incision was extended laterally with Mayo scissors. The superior aspect of the fascial incision was grasped with Coker clamps x2, tented up and the rectus muscles dissected off sharply with the electrocautery unit area and the same procedure was repeated on the inferior aspect of the fascial incision. The rectus muscles were separated in the midline. The abdominal peritoneum was identified, tented up, entered sharply, and the incision was extended superiorly and inferiorly with good visualization of the bladder. The Alexis retractor was then deployed. The vesicouterine peritoneum was identified, tented up, entered sharply, and the bladder flap was created digitally. Scalpel was then used to make a low transverse incision on the uterus which was extended laterally with blunt dissection. The fetal vertex was identified, delivered easily through the uterine incision followed by the body. The infant was bulb suctioned on the operative field cried vigorously, cord was clamped and cut and the infant was passed to the waiting neonatologist. Placenta was then delivered spontaneously, the uterus was cleared of all clot and debris. The uterine incision was repaired with #1 chromic in running locked fashion in a single layer. The ovaries and tubes were inspected for the above findings. The isthmic portion of the left fallopian tube was identified, grasped with a Babcock clamp, elevated, and a Filshie clip was applied circumferentially. The same procedure was repeated on the right fallopian tube. The uterus was returned to the abdominal cavity the abdominal cavity was cleared of all clot and debris. The Alexis retractor was  removed.  The abdominal peritoneum was reapproximated with 2-0 Vicryl in a running fashion, the rectus muscles was reapproximated with #1 chromic in a running fashion. The fascia was closed with a looped PDS in a running fashion. The skin was closed with 4-0 vicryl in a subcuticular fashion and surgical skin glue. All sponge lap and needle counts were correct x2. Patient tolerated the procedure well and recovered in stable condition following the procedure.

## 2015-05-04 NOTE — H&P (Signed)
Emily Rogers is a 34 y.o. female presenting for elevated BPs  34 year old gravida 1 para 0 at 38+1 weeks presents to maternity admission for evaluation of elevated blood pressures. In the office today she had labile blood pressures. Her initial blood pressure was 170/90 and then on repeat it was 140/88. Given the discrepancy she was sent over for additional monitoring. In maternity admission the patient's blood pressures were consistently elevated between 150-170/80-90.   Her pregnancy has been complicated by persistent breech presentation. She was scheduled for primary cesarean section at 39 weeks. An ultrasound performed in the office today demonstrated persistent breech presentation.   History OB History    Gravida Para Term Preterm AB TAB SAB Ectopic Multiple Living   1              Past Medical History  Diagnosis Date  . Injury of flexor tendon of left hand 09/2013    ring finger  . Nasal congestion 09/25/2013  . Dental crowns present     x 3   Past Surgical History  Procedure Laterality Date  . Appendectomy  1988  . Laparoscopic lysis of adhesions  2004  . Laparoscopic ovarian cystectomy  2006    with lysis of adhesions  . Tendon repair Left 09/29/2013    Procedure: REPAIR FLEXOR TENDON LEFT RING FINGER;  Surgeon: Nicki ReaperGary R Kuzma, MD;  [facility]  [facility];  Service: Orthopedics;  Laterality: Left;   Family History: family history is not on file. Social History:  reports that she has never smoked. She has never used smokeless tobacco. She reports that she drinks alcohol. She reports that she does not use illicit drugs.   Prenatal Transfer Tool  Maternal Diabetes: No Genetic Screening: Normal, NIPT low risl T13/18/21, female gender Maternal Ultrasounds/Referrals: Normal Fetal Ultrasounds or other Referrals:  None Maternal Substance Abuse:  No Significant Maternal Medications:  None Significant Maternal Lab Results:  None Other Comments:  None  ROS: as  above    Blood pressure 152/93, pulse 94, temperature 98.7 F (37.1 C), temperature source Oral, resp. rate 20, height 5' 8.5" (1.74 m), weight 83.462 kg (184 lb). Exam Physical Exam  Prenatal labs: ABO, Rh:  A+ Antibody:  Neg Rubella:  Imm RPR:   NR HBsAg:   Neg HIV:   NR GBS:   Neg  Assessment/Plan: 1) Admit 2) proceed with primary cesarean section for breech. Per the prenatal record the patient desires tubal ligation with cesarean. Will confirm prior to proceeding. 3) SCDs for DVT prophylaxis 4) Ancef 2 g on-call to the operating room  Emily Rogers H. 05/04/2015, 6:32 PM

## 2015-05-04 NOTE — Transfer of Care (Signed)
Immediate Anesthesia Transfer of Care Note  Patient: Emily Rogers  Procedure(s) Performed: Procedure(s): CESAREAN SECTION (N/A)  Patient Location: PACU  Anesthesia Type:Spinal  Level of Consciousness: awake  Airway & Oxygen Therapy: Patient Spontanous Breathing  Post-op Assessment: Report given to RN and Post -op Vital signs reviewed and stable  Post vital signs: stable  Last Vitals:  Filed Vitals:   05/04/15 2100  BP:   Pulse:   Temp: 36.3 C  Resp:     Complications: No apparent anesthesia complications

## 2015-05-04 NOTE — Anesthesia Procedure Notes (Signed)
Spinal Patient location during procedure: OR Staffing Anesthesiologist: CARIGNAN, PETER Performed by: anesthesiologist  Preanesthetic Checklist Completed: patient identified, site marked, surgical consent, pre-op evaluation, timeout performed, IV checked, risks and benefits discussed and monitors and equipment checked Spinal Block Patient position: sitting Prep: DuraPrep Patient monitoring: heart rate, continuous pulse ox and blood pressure Approach: right paramedian Location: L4-5 Injection technique: single-shot Needle Needle type: Sprotte  Needle gauge: 24 G Needle length: 9 cm Additional Notes Expiration date of kit checked and confirmed. Patient tolerated procedure well, without complications.     

## 2015-05-04 NOTE — MAU Provider Note (Signed)
History     CSN: [id number]  Arrival date and time: 05/04/15 1615   First Provider Initiated Contact with Patient 05/04/15 1724      Chief Complaint  Patient presents with  . Hypertension   HPI   Ms. Emily Rogers is a 34 y.o.female G1P0 at 97w1dwho presents from her Doctors office for lab work. Her blood pressure was elevated today at her prenatal appointment.   She is scheduled for a primary cesarean section next Wednesday for breech presentation.   + fetal movements Denies vaginal bleeding  Denies leaking of fluid.   OB History    Gravida Para Term Preterm AB TAB SAB Ectopic Multiple Living   1               Past Medical History  Diagnosis Date  . Injury of flexor tendon of left hand 09/2013    ring finger  . Nasal congestion 09/25/2013  . Dental crowns present     x 3    Past Surgical History  Procedure Laterality Date  . Appendectomy  1988  . Laparoscopic lysis of adhesions  2004  . Laparoscopic ovarian cystectomy  2006    with lysis of adhesions  . Tendon repair Left 09/29/2013    Procedure: REPAIR FLEXOR TENDON LEFT RING FINGER;  Surgeon: GWynonia Sours MD;  [place] [facility]  Service: Orthopedics;  Laterality: Left;    No family history on file.  History  Substance Use Topics  . Smoking status: Never Smoker   . Smokeless tobacco: Never Used  . Alcohol Use: Yes     Comment: 3-4 x/week    Allergies: No Known Allergies  Prescriptions prior to admission  Medication Sig Dispense Refill Last Dose  . b complex vitamins tablet Take 1 tablet by mouth daily.   05/04/2015 at Unknown time  . calcium carbonate (TUMS - DOSED IN MG ELEMENTAL CALCIUM) 500 MG chewable tablet Chew 3 tablets by mouth 2 (two) times daily.   05/03/2015 at Unknown time  . cholecalciferol (VITAMIN D) 1000 UNITS tablet Take 1,000 Units by mouth daily.   05/04/2015 at Unknown time  . fish oil-omega-3 fatty acids 1000 MG capsule Take 2 g by mouth 2 (two) times daily.     05/04/2015 at Unknown time  . loratadine-pseudoephedrine (CLARITIN-D 12-HOUR) 5-120 MG per tablet Take 1 tablet by mouth daily as needed for allergies.   05/03/2015 at Unknown time  . oxymetazoline (AFRIN) 0.05 % nasal spray Place 1 spray into both nostrils at bedtime.   05/04/2015 at Unknown time  . Prenatal Vit-Fe Fumarate-FA (PRENATAL MULTIVITAMIN) TABS tablet Take 1 tablet by mouth daily at 12 noon.   05/04/2015 at Unknown time  . HYDROcodone-acetaminophen (LORTAB 5) 5-500 MG per tablet Take 1 tablet by mouth every 6 (six) hours as needed for pain. (Patient not taking: Reported on 04/28/2015) 30 tablet 0 Not Taking at Unknown time  . valACYclovir (VALTREX) 1000 MG tablet Take 1,000 mg by mouth daily as needed (outbreaks).   prn   Results for orders placed or performed during the hospital encounter of 05/04/15 (from the past 48 hour(s))  Protein / creatinine ratio, urine     Status: Abnormal   Collection Time: 05/04/15  4:50 PM  Result Value Ref Range   Creatinine, Urine 186.00 mg/dL   Total Protein, Urine 49 mg/dL    Comment: NO NORMAL RANGE ESTABLISHED FOR THIS TEST   Protein Creatinine Ratio 0.26 (H) 0.00 - 0.15 mg/mg[Cre]  Urinalysis, Routine w reflex microscopic     Status: Abnormal   Collection Time: 05/04/15  4:50 PM  Result Value Ref Range   Color, Urine YELLOW YELLOW   APPearance CLEAR CLEAR   Specific Gravity, Urine >1.030 (H) 1.005 - 1.030   pH 6.0 5.0 - 8.0   Glucose, UA NEGATIVE NEGATIVE mg/dL   Hgb urine dipstick NEGATIVE NEGATIVE   Bilirubin Urine NEGATIVE NEGATIVE   Ketones, ur NEGATIVE NEGATIVE mg/dL   Protein, ur 30 (A) NEGATIVE mg/dL   Urobilinogen, UA 0.2 0.0 - 1.0 mg/dL   Nitrite NEGATIVE NEGATIVE   Leukocytes, UA NEGATIVE NEGATIVE  Urine microscopic-add on     Status: Abnormal   Collection Time: 05/04/15  4:50 PM  Result Value Ref Range   Squamous Epithelial / LPF FEW (A) RARE   WBC, UA 0-2 <3 WBC/hpf   Urine-Other MUCOUS PRESENT   CBC     Status: Abnormal    Collection Time: 05/04/15  5:40 PM  Result Value Ref Range   WBC 12.1 (H) 4.0 - 10.5 K/uL   RBC 3.72 (L) 3.87 - 5.11 MIL/uL   Hemoglobin 11.7 (L) 12.0 - 15.0 g/dL   HCT 33.8 (L) 36.0 - 46.0 %   MCV 90.9 78.0 - 100.0 fL   MCH 31.5 26.0 - 34.0 pg   MCHC 34.6 30.0 - 36.0 g/dL   RDW 13.5 11.5 - 15.5 %   Platelets 137 (L) 150 - 400 K/uL  Comprehensive metabolic panel     Status: Abnormal   Collection Time: 05/04/15  5:40 PM  Result Value Ref Range   Sodium 136 135 - 145 mmol/L   Potassium 3.9 3.5 - 5.1 mmol/L   Chloride 104 101 - 111 mmol/L   CO2 23 22 - 32 mmol/L   Glucose, Bld 90 65 - 99 mg/dL   BUN 10 6 - 20 mg/dL   Creatinine, Ser 0.67 0.44 - 1.00 mg/dL   Calcium 9.0 8.9 - 10.3 mg/dL   Total Protein 6.1 (L) 6.5 - 8.1 g/dL   Albumin 2.9 (L) 3.5 - 5.0 g/dL   AST 26 15 - 41 U/L   ALT 24 14 - 54 U/L   Alkaline Phosphatase 82 38 - 126 U/L   Total Bilirubin 0.2 (L) 0.3 - 1.2 mg/dL   GFR calc non Af Amer >60 >60 mL/min   GFR calc Af Amer >60 >60 mL/min    Comment: (NOTE) The eGFR has been calculated using the CKD EPI equation. This calculation has not been validated in all clinical situations. eGFR's persistently <60 mL/min signify possible Chronic Kidney Disease.    Anion gap 9 5 - 15  Uric acid     Status: None   Collection Time: 05/04/15  5:40 PM  Result Value Ref Range   Uric Acid, Serum 6.2 2.3 - 6.6 mg/dL  Lactate dehydrogenase     Status: None   Collection Time: 05/04/15  5:40 PM  Result Value Ref Range   LDH 130 98 - 192 U/L    Review of Systems  Eyes: Negative for blurred vision.  Cardiovascular: Negative for leg swelling.  Gastrointestinal: Negative for abdominal pain.  Neurological: Negative for headaches.   Physical Exam   Blood pressure 152/93, pulse 94, temperature 98.7 F (37.1 C), temperature source Oral, resp. rate 20, height 5' 8.5" (1.74 m), weight 83.462 kg (184 lb).  Physical Exam  Constitutional: She is oriented to person, place, and time. She  appears well-developed and well-nourished. No distress.  HENT:  Head: Normocephalic.  Eyes: Pupils are equal, round, and reactive to light.  Neck: Neck supple.  Cardiovascular: Normal rate.   GI: Soft. She exhibits no distension. There is no tenderness.  Musculoskeletal: Normal range of motion. She exhibits no edema.  Neurological: She is alert and oriented to person, place, and time. She has normal reflexes.  Negative clonus   Skin: Skin is warm. She is not diaphoretic.  Psychiatric: Her behavior is normal.     Fetal Tracing: Baseline: 145 bpm  Variability: Moderate  Accelerations: 15x15 Decelerations: none Toco: Q2-3 mins with irregular pattern    MAU Course  Procedures  None  MDM PIH labs  Discussed labs and BP readings with Dr. Harrington Challenger term gestation with pregnancy induced hypertension> plan for cesarean section tonight. Dr. Harrington Challenger to come to MAU.   Assessment and Plan   A:  Gestational hypertension at term  P:  Prep for OR per Dr. Pincus Sanes I Donnajean Chesnut, NP 05/04/2015 5:30 PM

## 2015-05-05 ENCOUNTER — Encounter (HOSPITAL_COMMUNITY): Payer: Self-pay | Admitting: Obstetrics and Gynecology

## 2015-05-05 DIAGNOSIS — Z98891 History of uterine scar from previous surgery: Secondary | ICD-10-CM

## 2015-05-05 DIAGNOSIS — O133 Gestational [pregnancy-induced] hypertension without significant proteinuria, third trimester: Secondary | ICD-10-CM | POA: Diagnosis present

## 2015-05-05 LAB — OB RESULTS CONSOLE ABO/RH: RH Type: POSITIVE

## 2015-05-05 LAB — CBC
HCT: 33.3 % — ABNORMAL LOW (ref 36.0–46.0)
Hemoglobin: 11.5 g/dL — ABNORMAL LOW (ref 12.0–15.0)
MCH: 31.3 pg (ref 26.0–34.0)
MCHC: 34.5 g/dL (ref 30.0–36.0)
MCV: 90.7 fL (ref 78.0–100.0)
PLATELETS: 121 10*3/uL — AB (ref 150–400)
RBC: 3.67 MIL/uL — AB (ref 3.87–5.11)
RDW: 13.4 % (ref 11.5–15.5)
WBC: 14.3 10*3/uL — AB (ref 4.0–10.5)

## 2015-05-05 LAB — ABO/RH: ABO/RH(D): O POS

## 2015-05-05 LAB — RPR: RPR: NONREACTIVE

## 2015-05-05 LAB — OB RESULTS CONSOLE GBS: GBS: NEGATIVE

## 2015-05-05 LAB — OB RESULTS CONSOLE HIV ANTIBODY (ROUTINE TESTING): HIV: NONREACTIVE

## 2015-05-05 LAB — OB RESULTS CONSOLE RPR: RPR: NONREACTIVE

## 2015-05-05 LAB — OB RESULTS CONSOLE RUBELLA ANTIBODY, IGM: RUBELLA: IMMUNE

## 2015-05-05 LAB — OB RESULTS CONSOLE HEPATITIS B SURFACE ANTIGEN: Hepatitis B Surface Ag: NEGATIVE

## 2015-05-05 MED ORDER — DIPHENHYDRAMINE HCL 25 MG PO CAPS: 25.0000 mg | ORAL_CAPSULE | Freq: Four times a day (QID) | ORAL | Status: DC | PRN

## 2015-05-05 MED ORDER — OXYCODONE-ACETAMINOPHEN 5-325 MG PO TABS
1.0000 | ORAL_TABLET | ORAL | Status: DC | PRN
  Administered 2015-05-06 (×3): 1 via ORAL
  Filled 2015-05-05 (×4): qty 1

## 2015-05-05 MED ORDER — OXYTOCIN 40 UNITS IN LACTATED RINGERS INFUSION - SIMPLE MED: 62.5000 mL/h | INTRAVENOUS | Status: AC

## 2015-05-05 MED ORDER — SIMETHICONE 80 MG PO CHEW
80.0000 mg | CHEWABLE_TABLET | Freq: Three times a day (TID) | ORAL | Status: DC
  Administered 2015-05-05 – 2015-05-07 (×7): 80 mg via ORAL
  Filled 2015-05-05 (×7): qty 1

## 2015-05-05 MED ORDER — SIMETHICONE 80 MG PO CHEW
80.0000 mg | CHEWABLE_TABLET | ORAL | Status: DC
  Administered 2015-05-05 – 2015-05-06 (×2): 80 mg via ORAL
  Filled 2015-05-05 (×2): qty 1

## 2015-05-05 MED ORDER — ZOLPIDEM TARTRATE 5 MG PO TABS: 5.0000 mg | ORAL_TABLET | Freq: Every evening | ORAL | Status: DC | PRN

## 2015-05-05 MED ORDER — SENNOSIDES-DOCUSATE SODIUM 8.6-50 MG PO TABS
2.0000 | ORAL_TABLET | ORAL | Status: DC
  Administered 2015-05-05 – 2015-05-06 (×2): 2 via ORAL
  Filled 2015-05-05 (×2): qty 2

## 2015-05-05 MED ORDER — LANOLIN HYDROUS EX OINT: 1.0000 "application " | TOPICAL_OINTMENT | CUTANEOUS | Status: DC | PRN

## 2015-05-05 MED ORDER — DIBUCAINE 1 % RE OINT: 1.0000 "application " | TOPICAL_OINTMENT | RECTAL | Status: DC | PRN

## 2015-05-05 MED ORDER — LACTATED RINGERS IV SOLN: INTRAVENOUS | Status: DC

## 2015-05-05 MED ORDER — WITCH HAZEL-GLYCERIN EX PADS: 1.0000 "application " | MEDICATED_PAD | CUTANEOUS | Status: DC | PRN

## 2015-05-05 MED ORDER — SIMETHICONE 80 MG PO CHEW: 80.0000 mg | CHEWABLE_TABLET | ORAL | Status: DC | PRN

## 2015-05-05 MED ORDER — ACETAMINOPHEN 325 MG PO TABS
650.0000 mg | ORAL_TABLET | ORAL | Status: DC | PRN
  Administered 2015-05-05: 650 mg via ORAL
  Filled 2015-05-05 (×2): qty 2

## 2015-05-05 MED ORDER — TETANUS-DIPHTH-ACELL PERTUSSIS 5-2.5-18.5 LF-MCG/0.5 IM SUSP: 0.5000 mL | Freq: Once | INTRAMUSCULAR | Status: DC

## 2015-05-05 MED ORDER — MENTHOL 3 MG MT LOZG: 1.0000 | LOZENGE | OROMUCOSAL | Status: DC | PRN

## 2015-05-05 MED ORDER — LACTATED RINGERS IV SOLN
INTRAVENOUS | Status: DC
  Administered 2015-05-05: 06:00:00 via INTRAVENOUS

## 2015-05-05 MED ORDER — OXYCODONE-ACETAMINOPHEN 5-325 MG PO TABS
2.0000 | ORAL_TABLET | ORAL | Status: DC | PRN
  Administered 2015-05-06 – 2015-05-07 (×3): 2 via ORAL
  Filled 2015-05-05 (×3): qty 2

## 2015-05-05 MED ORDER — PRENATAL MULTIVITAMIN CH
1.0000 | ORAL_TABLET | Freq: Every day | ORAL | Status: DC
  Administered 2015-05-05 – 2015-05-06 (×2): 1 via ORAL
  Filled 2015-05-05 (×2): qty 1

## 2015-05-05 NOTE — Lactation Note (Addendum)
This note was copied from the chart of Emily Rudolpho SevinSarah Rogers. Lactation Consultation Note  Patient Name: Emily Rogers WUJWJ'XToday's Date: 05/05/2015 Reason for consult: Initial assessment Baby 21 hours of life. Mom holding [facility] in cradle position [facility]. Assisted mom to latch baby in both the football position and then in cross-cradle position. Mom able to hand express colostrum and baby latches deeply, but only suckles a few times before sleeping. Undressed baby and mom able to re-latch baby, but mom keeps compressing breast with fingers in scissors position and pulls breast out of baby's mouth. LC placed gloved finger in baby's mouth and baby chomped at finger for a while. After latching, baby willing to suckle finger when attempted again. Enc mom to keep baby at breast, STS, and keep working to latch baby. Enc mom to nurse with cues. Enc mom to keep expressing drops into baby's mouth as well. Also enc mom to keep breast compressed/sandwiched to match the baby's mouth in order to maintain a deep latch. Discussed normal progression of milk coming in, and baby being sleepy and needing to be stimulated to keep suckling. Discussed assessment, interventions, and plan with patient's RN, Emily Rogers.   Maternal Data Has patient been taught Hand Expression?: Yes Does the patient have breastfeeding experience prior to this delivery?: No  Feeding Feeding Type: Breast Fed Length of feed:  (Baby suckling off and on, sleepy, enc mom to keep attempting.)  LATCH Score/Interventions Latch: Repeated attempts needed to sustain latch, nipple held in mouth throughout feeding, stimulation needed to elicit sucking reflex. Intervention(s): Adjust position;Assist with latch;Breast compression  Audible Swallowing: None Intervention(s): Skin to skin;Hand expression  Type of Nipple: Everted at rest and after stimulation  Comfort (Breast/Nipple): Soft / non-tender     Hold (Positioning): Assistance needed to  correctly position infant at breast and maintain latch. Intervention(s): Breastfeeding basics reviewed;Support Pillows;Skin to skin;Position options  LATCH Score: 6  Lactation Tools Discussed/Used     Consult Status Consult Status: Follow-up Date: 05/06/15 Follow-up type: In-patient    Emily Rogers, Emily Rogers 05/05/2015, 5:44 PM

## 2015-05-05 NOTE — Addendum Note (Signed)
Addendum  created 05/05/15 0750 by Shanon PayorSuzanne M Elaf Clauson, CRNA   Modules edited: Notes Section   Notes Section:  File: [id number]

## 2015-05-05 NOTE — Progress Notes (Signed)
Subjective: Postpartum Day 1: Cesarean Delivery Patient reports incisional pain, tolerating PO and + flatus.    Objective: Vital signs in last 24 hours: Temp:  [97.4 F (36.3 C)-100 F (37.8 C)] 99.2 F (37.3 C) (05/19 0545) Pulse Rate:  [72-99] 90 (05/19 0545) Resp:  [10-21] 18 (05/19 0545) BP: (134-172)/(80-145) 134/80 mmHg (05/19 0545) SpO2:  [95 %-99 %] 98 % (05/19 0545) Weight:  [83.462 kg (184 lb)] 83.462 kg (184 lb) (05/18 1644)  Physical Exam:  General: alert, cooperative and no distress Lochia: appropriate Uterine Fundus: firm Incision: healing well, no significant drainage, no dehiscence, no significant erythema DVT Evaluation:SCDs on and running no evidence of DVT seen on physical exam. Negative Homan's sign. No cords or calf tenderness. No significant calf/ankle edema.   Recent Labs  05/04/15 1740 05/05/15 0525  HGB 11.7* 11.5*  HCT 33.8* 33.3*    Assessment/Plan: Status post Cesarean section. Doing well postoperatively.  Saline lock IV. D/C foley Advance diet Encourage ambulation  Torria Fromer STACIA 05/05/2015, 8:10 AM

## 2015-05-05 NOTE — Progress Notes (Signed)
Notified MD about patient's blood pressures being elevated. No new orders received. Instructed not to give any NSAID's because it could elevate blood pressure. Will continue to monitor.

## 2015-05-05 NOTE — Anesthesia Postprocedure Evaluation (Signed)
  Anesthesia Post-op Note  Patient: Emily SevinSarah Mcfaul  Procedure(s) Performed: Procedure(s): CESAREAN SECTION (N/A) BILATERAL TUBAL LIGATION  Patient Location: Mother/Baby  Anesthesia Type:Spinal  Level of Consciousness: awake, alert  and oriented  Airway and Oxygen Therapy: Patient Spontanous Breathing  Post-op Pain: none  Post-op Assessment: Post-op Vital signs reviewed, Patient's Cardiovascular Status Stable, Respiratory Function Stable, No headache, No backache, No residual numbness and No residual motor weakness  Post-op Vital Signs: Reviewed and stable  Last Vitals:  Filed Vitals:   05/05/15 0545  BP: 134/80  Pulse: 90  Temp: 37.3 C  Resp: 18    Complications: No apparent anesthesia complications

## 2015-05-06 LAB — BIRTH TISSUE RECOVERY COLLECTION (PLACENTA DONATION)

## 2015-05-06 NOTE — Progress Notes (Signed)
Patient is eating, ambulating, voiding.  Pain control is good.  Difficulties with breastfeeding.  Otherwise appropropriate lochia and no complaints.  Filed Vitals:   05/05/15 0830 05/05/15 1230 05/05/15 1700 05/06/15 0602  BP: 136/82 138/81 147/83 138/79  Pulse: 80 79 69 69  Temp: 99.2 F (37.3 C) 98.8 F (37.1 C) 99 F (37.2 C) 98.5 F (36.9 C)  TempSrc:   Oral Oral  Resp: 20 20 20 18   Height:      Weight:      SpO2: 95% 95%      Fundus firm Perineum without swelling. Inc: c/d/i Ext: no CT  Lab Results  Component Value Date   WBC 14.3* 05/05/2015   HGB 11.5* 05/05/2015   HCT 33.3* 05/05/2015   MCV 90.7 05/05/2015   PLT 121* 05/05/2015    --/Positive/-- (05/19 0912)  A/P Post op day #2 Pt wanted to do go home today, however peds feels baby should stay until tomorrow.  Routine care.  Expect d/c 5/21    MineralALLAHAN, Luther ParodySIDNEY

## 2015-05-06 NOTE — Progress Notes (Signed)
MD notified of BP of 153/96

## 2015-05-06 NOTE — Progress Notes (Signed)
Mom called out to the desk stating that she wanted formula for her baby because she felt that the baby was not getting enough from her. Explained to mom about baby being in the 24-48 hour range and that her belly was still small. Baby was very vigorous at the breast and was showing lots of feeding cues. Baby was having difficulty sustaining a latch so I gave mom a #20 nipple shield. Baby was able to latch on and feed for about 10 minutes. Mom then called out again and stated that the suckling of the nipple shield had really stimulated her to eat and that she was still not getting enough so she wanted formula. Explained to mom and told her that I could set her up with a double electric breast pump. Mom stated that she was not going to pump tonight and that she wanted to give formula.  Baby only took a very small amount of formula and was satisfied. Will continue to monitor and tell mom that baby needs to eat a lot more before she can be discharged home.

## 2015-05-06 NOTE — Progress Notes (Signed)
Throughout the night I have had to keep reminding mom to waken the baby so that she could feed and that she does not need to go longer than 4-5 hours without at least doing skin to skin. Mom states "baby is sleeping so good". Reminded mom about the baby being swaddled and that she will stay asleep if she is wrapped up and warm.

## 2015-05-06 NOTE — Lactation Note (Signed)
This note was copied from the chart of Emily Rogers. Lactation Consultation Note  Patient Name: Emily Rogers ZOXWR'UToday's Date: 05/06/2015 Reason for consult: Follow-up assessment;Breast/nipple pain;Difficult latch;Other (Comment) (using a Nipple shield . see LC note )  Baby is 6844 hours old and has been to the breast with a NS in the baby's life, recently has had formula supplement with a bottle per mom's choice  Due to nipples feeling sore, and when she was hand expressing only getting drops. LC reassured mom there will be more milk . LC reviewed hand expressing from both breast and several cc's expressed off the right breast , ans only 2 drops  Form the left breast. Mom seemed encouraged. Since the baby has had a bottle at 1500, and mom is using a Nipple Shield, LC assisted mom to set up her DEBP Ameda pump  LC assisted with set up and noted the flange #25 to be boarderline fit once the suction was increased slightly. LC suggested to mom to have dad check with local retails to see if she could obtain the larger size. [facility]'t [facility] the Ameda Flanges. LC also offered to mom to set up the DEBP Medela pump the hospital uses so mom can work on establishing her milk supply, especially due to having to use a Nipple shield. Since mom has started supplementing. LC resized for a larger NS, and also showed how to instill EBM or formula into the top of the NS after applied to her nipple so the baby will have an appetizer. Instructed mom on the use of the Comfort gels.  LC asked mom to call on the nurses light [facility]  [facility].    Maternal Data Has patient been taught Hand Expression?: Yes  Feeding Feeding Type:  (baby recently was fed at 1500 from a bottle  per mom ) Nipple Type: Slow - flow  LATCH Score/Interventions                Intervention(s): Breastfeeding basics reviewed (see LC note )     Lactation Tools Discussed/Used Tools: Nipple  Shields Nipple shield size: 20;Other (comment);24 (LC rechecked size of the NS, #24 NS was a better fit due to large nipple , and sore nipple )   Consult Status Consult Status: Follow-up Date: 05/06/15 Follow-up type: In-patient    Kathrin Greathouseorio, Mathis Cashman Ann 05/06/2015, 5:28 PM

## 2015-05-07 MED ORDER — OXYCODONE-ACETAMINOPHEN 5-325 MG PO TABS: 1.0000 | ORAL_TABLET | ORAL | Status: DC | PRN

## 2015-05-07 NOTE — Lactation Note (Signed)
This note was copied from the chart of Emily Rudolpho SevinSarah Rogers. Lactation Consultation Note  Mother has been using NS and putting formula in NS because she feels she is not getting enough breast milk. Mother has pumped 2x since birth.  Encouraged bf and then pumping after to help establish her milk supply. Mother seems overwhelmed here in the hospital with visitors and interruptions and feels she will be able to bf and pump once she gets home. Provided mother with volume guidelines for the times she has been giving formula. Set up outpatient appt.for Thurs. 5/26 2:30 pm. Discussed engorgement care and monitoring voids/stools.   Patient Name: Emily Rudolpho SevinSarah Zia ZOXWR'UToday's Rogers: 05/07/2015     Maternal Data    Feeding Feeding Type: Formula Length of feed: 10 min (on & off mostly off)  [facility]                      Lactation Tools Discussed/Used     Consult Status      Hardie PulleyBerkelhammer, Ruth Boschen 05/07/2015, 9:47 AM

## 2015-05-08 NOTE — Discharge Summary (Signed)
Obstetric Discharge Summary Reason for Admission: cesarean section Prenatal Procedures: ultrasound and gestational hypertension eval Intrapartum Procedures: cesarean: low cervical, transverse  With BTL Postpartum Procedures: none Complications-Operative and Postpartum: none HEMOGLOBIN  Date Value Ref Range Status  05/05/2015 11.5* 12.0 - 15.0 g/dL Final   HCT  Date Value Ref Range Status  05/05/2015 33.3* 36.0 - 46.0 % Final    Physical Exam:  General: alert and cooperative Lochia: appropriate Uterine Fundus: firm Incision: healing well, no significant drainage DVT Evaluation: No evidence of DVT seen on physical exam.  Discharge Diagnoses: Term Pregnancy-delivered  Discharge Information: Date: 05/08/2015 Activity: pelvic rest Diet: routine Medications: PNV, Ibuprofen and Percocet Condition: stable Instructions: refer to practice specific booklet Discharge to: home   Newborn Data: Live born female  Birth Weight: 6 lb 13 oz (3090 g) APGAR: 8, 9  Home with mother.  Philip AspenCALLAHAN, Emily Difrancesco 05/08/2015, 8:46 AM

## 2015-05-10 ENCOUNTER — Other Ambulatory Visit (HOSPITAL_COMMUNITY): Payer: Federal, State, Local not specified - PPO

## 2015-05-11 ENCOUNTER — Encounter (HOSPITAL_COMMUNITY): Admission: RE | Payer: Self-pay | Source: Ambulatory Visit

## 2015-05-11 ENCOUNTER — Inpatient Hospital Stay (HOSPITAL_COMMUNITY)
Admission: RE | Admit: 2015-05-11 | Payer: Federal, State, Local not specified - PPO | Source: Ambulatory Visit | Admitting: Obstetrics

## 2015-05-11 SURGERY — Surgical Case
Anesthesia: Regional

## 2015-05-12 ENCOUNTER — Ambulatory Visit (HOSPITAL_COMMUNITY): Payer: Federal, State, Local not specified - PPO

## 2015-05-17 ENCOUNTER — Ambulatory Visit (HOSPITAL_COMMUNITY)
Admission: RE | Admit: 2015-05-17 | Discharge: 2015-05-17 | Disposition: A | Discharge status: Home / Self Care | Payer: Federal, State, Local not specified - PPO | Source: Ambulatory Visit | Attending: Obstetrics and Gynecology | Admitting: Obstetrics and Gynecology

## 2015-05-17 NOTE — Lactation Note (Signed)
Lactation Consult  Mother's reason for visit:  Help with pumping, BF and formula Visit Type:  OP Appointment Notes:  Emily Rogers has not been BF because she won't latch.  Mom has been pumping but only 3 times in 24 hours.  The yield is about 6 ounces total.  The family has been traveling and attention to BF/ milk production has been challenging. Mom is weepy today because her desire is to BF.  We attempted to latch her to the left breast but she did not stay attached so a #24NS was applied.  Emily Rogers transferred  8 ml in10 minutes.  She was repositioned in a FB hold on the same side and transferred an additional 8 ml.   Her lower jaw quivers when she is feeding at the breast which is a sign of muscle fatique. She was place on the right breast and transferred 14 ml in the FB hold and ml in the cross cradle. Mom pumped 20 ml from the left breast while she was feeding on the right. Mom felt more confident as the session progressed. Emily Rogers was supplemented with an additional 45 ml and she was content.  It was also noted that Emily Rogers had some oral restrictions so mom is going to look up information on "tongue-tie" at Dr.Ghaheri's website and also [facility]  Because mom's MS needs to increase and so as not to overwhelm her the Plan is to BF 4 times in 24 which is up from 0 times.  Offer 2 ounces of BM after BF. Post pump for 15 to 20 minutes and pump 4 other times in a 24 hours period.  Try power pumping.  Work on [employer].  Follow-up planned for June 10,2016  Lactation Consultant:  Soyla DryerJoseph, Oris Staffieri  ________________________________________________________________________  Emily FloresBaby's Name: Emily Rogers Date of Birth: 05/04/2017 [facility] [place] Gender: female Gestational Age: 1140w1d (At Birth) Birth Weight: 6 lb 13 oz (3090 g) Weight at Discharge: Weight: 6 lb 8.9 oz (2975 g)Date of Discharge: 05/07/2015 [facility]   05/04/15 2007 05/06/15 0040  05/06/15 2319  Weight: 6 lb 13 oz (3090 g) 6 lb 8.6 oz (2965 g) 6 lb 8.9 oz (2975 g)   [facility] 05/09/2015 Location:Pediatrician's office Weight today: 3366 7# 6.7oz   ________________________________________________________________________  Mother's Name: Emily SevinSarah Rogers ______________________________________________________________________  Voids:  6+ in 24 hrs.  Color:  Clear yellow Stools:  4-5 in 24 hrs.  Color:  Brown and Yellow  ________________________________________________________________________  Infant's oral assessment:  Variance

## 2015-05-27 ENCOUNTER — Ambulatory Visit (HOSPITAL_COMMUNITY)
Admission: RE | Admit: 2015-05-27 | Discharge: 2015-05-27 | Disposition: A | Discharge status: Home / Self Care | Payer: Federal, State, Local not specified - PPO | Source: Ambulatory Visit | Attending: Obstetrics and Gynecology | Admitting: Obstetrics and Gynecology

## 2015-05-27 NOTE — Lactation Note (Signed)
Lactation Consult  Mother's reason for visit:  Follow up visit Visit Type:  Feeding assessment, using NS Appointment Notes:  Mom states she is formula feeding and supplementing with a little breast milk Consult:  Follow-Up Lactation Consultant:  Audry Riles D  ________________________________________________________________________    ________________________________________________________________________  Mother's Name: Emily Rogers Type of delivery:    ________________________________________________________________________  Breastfeeding History (Post Discharge)  Frequency of breastfeeding:  2 times/day Duration of feeding: 35-45 min    Pumping  Type of pump:  Ameda Frequency:  4 times/day Volume:  1-3 ozml      Pre-feed weight:  3730 g  (8 lb. 3.6 oz.) Post-feed weight:  3736 g (8 lb. 3.8 oz.) Amount transferred:  6 ml Amount supplemented:  60 ml- mom had fed baby 1 hour before she came to appointment because baby was fussy    Mom reports that baby is now latching better without NS. Mom reports she is mostly feeding formula because she is not making enough milk.[name] had been fed before she came to appointment and has stuffy nose. Latched well to both breasts in football and cradle hold but no swallows noted and baby mostly non nutritive. Mom easily able to hand express whitish milk. Discussed working on building milk supply by more frequent pumping and nursing. Discussed Motherlove tea as supplement that may increase milk supply. Reports she has not looked at website information about tongue tie. Encouraged to look at that. No questions at present. To call prn. Will follow up with Ped.

## 2017-04-13 ENCOUNTER — Encounter (HOSPITAL_BASED_OUTPATIENT_CLINIC_OR_DEPARTMENT_OTHER): Payer: Self-pay | Admitting: Emergency Medicine

## 2017-04-13 ENCOUNTER — Emergency Department (HOSPITAL_BASED_OUTPATIENT_CLINIC_OR_DEPARTMENT_OTHER): Payer: Federal, State, Local not specified - PPO

## 2017-04-13 ENCOUNTER — Emergency Department (HOSPITAL_BASED_OUTPATIENT_CLINIC_OR_DEPARTMENT_OTHER)
Admission: EM | Admit: 2017-04-13 | Discharge: 2017-04-13 | Disposition: A | Discharge status: Home / Self Care | Payer: Federal, State, Local not specified - PPO | Attending: Emergency Medicine | Admitting: Emergency Medicine

## 2017-04-13 DIAGNOSIS — N12 Tubulo-interstitial nephritis, not specified as acute or chronic: Secondary | ICD-10-CM | POA: Insufficient documentation

## 2017-04-13 DIAGNOSIS — R109 Unspecified abdominal pain: Secondary | ICD-10-CM | POA: Diagnosis present

## 2017-04-13 LAB — URINALYSIS, ROUTINE W REFLEX MICROSCOPIC
Bilirubin Urine: NEGATIVE
Glucose, UA: NEGATIVE mg/dL
KETONES UR: NEGATIVE mg/dL
Nitrite: NEGATIVE
PH: 5.5 (ref 5.0–8.0)
PROTEIN: 100 mg/dL — AB
Specific Gravity, Urine: 1.018 (ref 1.005–1.030)

## 2017-04-13 LAB — BASIC METABOLIC PANEL
Anion gap: 11 (ref 5–15)
BUN: 9 mg/dL (ref 6–20)
CHLORIDE: 105 mmol/L (ref 101–111)
CO2: 21 mmol/L — ABNORMAL LOW (ref 22–32)
CREATININE: 0.64 mg/dL (ref 0.44–1.00)
Calcium: 9.1 mg/dL (ref 8.9–10.3)
GFR calc Af Amer: >60 mL/min (ref >60)
GFR calc non Af Amer: >60 mL/min (ref >60)
GLUCOSE: 87 mg/dL (ref 65–99)
POTASSIUM: 3.5 mmol/L (ref 3.5–5.1)
Sodium: 137 mmol/L (ref 135–145)

## 2017-04-13 LAB — CBC
HCT: 35.9 % — ABNORMAL LOW (ref 36.0–46.0)
HEMOGLOBIN: 12.3 g/dL (ref 12.0–15.0)
MCH: 31.3 pg (ref 26.0–34.0)
MCHC: 34.3 g/dL (ref 30.0–36.0)
MCV: 91.3 fL (ref 78.0–100.0)
PLATELETS: 205 10*3/uL (ref 150–400)
RBC: 3.93 MIL/uL (ref 3.87–5.11)
RDW: 12.5 % (ref 11.5–15.5)
WBC: 22.4 10*3/uL — ABNORMAL HIGH (ref 4.0–10.5)

## 2017-04-13 LAB — URINALYSIS, MICROSCOPIC (REFLEX)

## 2017-04-13 LAB — PREGNANCY, URINE: Preg Test, Ur: NEGATIVE

## 2017-04-13 LAB — I-STAT CG4 LACTIC ACID, ED: LACTIC ACID, VENOUS: 1.8 mmol/L (ref 0.5–1.9)

## 2017-04-13 MED ORDER — LEVOFLOXACIN 750 MG PO TABS: 750.0000 mg | ORAL_TABLET | Freq: Every day | ORAL | 0 refills | Status: AC

## 2017-04-13 MED ORDER — DEXTROSE 5 % IV SOLN
1.0000 g | Freq: Once | INTRAVENOUS | Status: AC
  Administered 2017-04-13: 1 g via INTRAVENOUS
  Filled 2017-04-13: qty 10

## 2017-04-13 NOTE — ED Provider Notes (Signed)
[facility] Provider Note  CSN: [id number] Arrival date & time: 04/13/17  2011  By signing my name below, I, Nelwyn Salisbury, attest that this documentation has been prepared under the direction and in the presence of non-physician practitioner, Audry Pili, PA-C. Electronically Signed: Nelwyn Salisbury, Scribe. 04/13/2017. 8:39 PM.  History   Chief Complaint Chief Complaint  Patient presents with  . Flank Pain  . Dysuria   The history is provided by the patient. No language interpreter was used.    HPI Comments:  Emily Rogers is a 36 y.o. female who presents to the Emergency Department complaining of gradually worsening, constant right-sided flank pain onset ~2 days. Her pain radiates into her lower right back. Intermittent. She reports associated nausea, dysuria, and urgency. Pt has not tried any medications for her symptoms PTA. Denies any abdominal pain or vomiting. No fevers. No chills. No meds PTA. Minimal pain currently. No other symptoms noted.  Past Medical History:  Diagnosis Date  . Dental crowns present    x 3  . Injury of flexor tendon of left hand 09/2013   ring finger  . Nasal congestion 09/25/2013    Patient Active Problem List   Diagnosis Date Noted  . S/P C-section 05/05/2015  . Gestational hypertension w/o significant proteinuria in 3rd trimester 05/05/2015    Past Surgical History:  Procedure Laterality Date  . APPENDECTOMY  1988  . CESAREAN SECTION N/A 05/04/2015   Procedure: CESAREAN SECTION;  Surgeon: Waynard Reeds, MD;  Location: [facility];  Service: Obstetrics;  Laterality: N/A;  . LAPAROSCOPIC LYSIS OF ADHESIONS  2004  . LAPAROSCOPIC OVARIAN CYSTECTOMY  2006   with lysis of adhesions  . TENDON REPAIR Left 09/29/2013   Procedure: REPAIR FLEXOR TENDON LEFT RING FINGER;  Surgeon: Nicki Reaper, MD;  Location: [facility];  Service: Orthopedics;  Laterality: Left;  . TUBAL LIGATION  05/04/2015   Procedure: BILATERAL TUBAL LIGATION;   Surgeon: Waynard Reeds, MD;  Location: [facility];  Service: Obstetrics;;    OB History    Gravida Para Term Preterm AB Living   SAB TAB Ectopic Multiple Live Births         0 1       Home Medications    Prior to Admission medications   Medication Sig Start Date End Date Taking? Authorizing Provider  b complex vitamins tablet Take 1 tablet by mouth daily.    Historical Provider, MD  calcium carbonate (TUMS - DOSED IN MG ELEMENTAL CALCIUM) 500 MG chewable tablet Chew 3 tablets by mouth 2 (two) times daily.    Historical Provider, MD  cholecalciferol (VITAMIN D) 1000 UNITS tablet Take 1,000 Units by mouth daily.    Historical Provider, MD  fish oil-omega-3 fatty acids 1000 MG capsule Take 2 g by mouth 2 (two) times daily.     Historical Provider, MD  loratadine-pseudoephedrine (CLARITIN-D 12-HOUR) 5-120 MG per tablet Take 1 tablet by mouth daily as needed for allergies.    Historical Provider, MD  oxyCODONE-acetaminophen (PERCOCET/ROXICET) 5-325 MG per tablet Take 1 tablet by mouth every 4 (four) hours as needed (for pain scale 4-7). 05/07/15   Philip Aspen, DO  oxymetazoline (AFRIN) 0.05 % nasal spray Place 1 spray into both nostrils at bedtime.    Historical Provider, MD  Prenatal Vit-Fe Fumarate-FA (PRENATAL MULTIVITAMIN) TABS tablet Take 1 tablet by mouth daily at 12 noon.    Historical Provider, MD  Family History No family history on file.  Social History Social History  Substance Use Topics  . Smoking status: Never Smoker  . Smokeless tobacco: Never Used  . Alcohol use Yes     Comment: 3-4 x/week     Allergies   Patient has no known allergies.   Review of Systems Review of Systems  Gastrointestinal: Negative for abdominal pain and vomiting.  Genitourinary: Positive for dysuria and flank pain.  Musculoskeletal: Positive for back pain.  All other systems reviewed and are negative.  Physical Exam Updated Vital Signs BP (!) 152/104 (BP Location: Left  Arm)   Pulse 95   Temp 98.1 F (36.7 C) (Oral)   Resp 18   Ht  (1.727 m)   Wt 72.6 kg   LMP 03/18/2017 Comment: tubal ligation  SpO2 99%   BMI 24.33 kg/m   Physical Exam  Constitutional: She is oriented to person, place, and time. Vital signs are normal. She appears well-developed and well-nourished. No distress.  HENT:  Head: Normocephalic and atraumatic.  Right Ear: Hearing normal.  Left Ear: Hearing normal.  Eyes: Conjunctivae and EOM are normal. Pupils are equal, round, and reactive to light.  Neck: Normal range of motion. Neck supple.  Cardiovascular: Normal rate, regular rhythm, normal heart sounds and intact distal pulses.   Pulmonary/Chest: Effort normal.  Abdominal: Soft. She exhibits no distension. There is tenderness.  Right CVA tenderness.   Musculoskeletal: Normal range of motion.  Neurological: She is alert and oriented to person, place, and time.  Skin: Skin is warm and dry.  Psychiatric: She has a normal mood and affect. Her speech is normal and behavior is normal. Thought content normal.  Nursing note and vitals reviewed.  ED Treatments / Results  DIAGNOSTIC STUDIES:  Oxygen Saturation is 99% on RA, normal by my interpretation.    COORDINATION OF CARE:  8:48 PM Discussed treatment plan with pt at bedside which includes blood work and imaging and pt agreed to plan.  Labs (all labs ordered are listed, but only abnormal results are displayed) Labs Reviewed  URINALYSIS, ROUTINE W REFLEX MICROSCOPIC - Abnormal; Notable for the following:       Result Value   APPearance CLOUDY (*)    Hgb urine dipstick LARGE (*)    Protein, ur 100 (*)    Leukocytes, UA MODERATE (*)    All other components within normal limits  CBC - Abnormal; Notable for the following:    WBC 22.4 (*)    HCT 35.9 (*)    All other components within normal limits  BASIC METABOLIC PANEL - Abnormal; Notable for the following:    CO2 21 (*)    All other components within normal limits   URINALYSIS, MICROSCOPIC (REFLEX) - Abnormal; Notable for the following:    Bacteria, UA MANY (*)    Squamous Epithelial / LPF 0-5 (*)    All other components within normal limits  PREGNANCY, URINE  I-STAT CG4 LACTIC ACID, ED    EKG  EKG Interpretation None       Radiology Ct Renal Stone Study  Result Date: 04/13/2017 CLINICAL DATA:  Right back pain with painful urination. EXAM: CT ABDOMEN AND PELVIS WITHOUT CONTRAST TECHNIQUE: Multidetector CT imaging of the abdomen and pelvis was performed following the standard protocol without IV contrast. COMPARISON:  None. FINDINGS: Lower chest: Normal Hepatobiliary: Liver parenchyma is normal.  No calcified gallstones. Pancreas: Normal Spleen: Normal Adrenals/Urinary Tract: Adrenal glands are normal. Kidneys are normal. No cyst,  mass, stone or hydronephrosis. No evidence of passing stone. No stone in the bladder. Stomach/Bowel: Normal. No evidence of ileus, obstruction or inflammatory disease. Previous appendectomy. Vascular/Lymphatic: Normal Reproductive: Uterus and adnexal regions are normal. Previous tubal ligation. Other: No free fluid or air. Musculoskeletal: Normal IMPRESSION: Normal examination. No abnormality seen to explain pain. No evidence of urinary tract disease. Electronically Signed   By: Paulina Fusi M.D.   On: 04/13/2017 21:42    Procedures Procedures (including critical care time)  Medications Ordered in ED Medications  cefTRIAXone (ROCEPHIN) 1 g in dextrose 5 % 50 mL IVPB (1 g Intravenous New Bag/Given 04/13/17 2237)   Initial Impression / Assessment and Plan / ED Course  I have reviewed the triage vital signs and the nursing notes.  Pertinent labs & imaging results that were available during my care of the patient were reviewed by me and considered in my medical decision making (see chart for details).   {I have reviewed and evaluated the relevant laboratory values. {I have reviewed and evaluated the relevant imaging  studies.  {I have reviewed the relevant previous healthcare records.  {I obtained HPI from historian. {Patient discussed with supervising physician.  ED Course:  Assessment: Pt is a 36 y.o. female who presents with right flank pain x 2 days. No fevers. Noted dysuria and urgency. On exam, pt in NAD. Nontoxic/nonseptic appearing. VSS. Afebrile. Lungs CTA. Heart RRR. Abdomen nontender soft. Right CVA noted. CBC leukocytosis 22. BMP unremarkable. UA shows UTI. CT Renal without stones. No hydronephrosis. Given Rocephin in ED. Plan is to DC home with ABX and close follow up to PCP. At time of discharge, Patient is in no acute distress. Vital Signs are stable. Patient is able to ambulate. Patient able to tolerate PO.   Disposition/Plan:  DC Home Additional Verbal discharge instructions given and discussed with patient.  Pt Instructed to f/u with PCP in the next week for evaluation and treatment of symptoms. Return precautions given Pt acknowledges and agrees with plan  Supervising Physician Shaune Pollack, MD  Final Clinical Impressions(s) / ED Diagnoses   Final diagnoses:  Pyelonephritis    New Prescriptions New Prescriptions   No medications on file   I personally performed the services described in this documentation, which was scribed in my presence. The recorded information has been reviewed and is accurate.    Audry Pili, PA-C 04/13/17 2259    Shaune Pollack, MD 04/14/17 [phone]

## 2017-04-13 NOTE — Discharge Instructions (Signed)
Please read and follow all provided instructions.  Your diagnoses today include:  1. Pyelonephritis     Tests performed today include: Vital signs. See below for your results today.   Medications prescribed:  Take as prescribed   Home care instructions:  Follow any educational materials contained in this packet.  Follow-up instructions: Please follow-up with your primary care provider for further evaluation of symptoms and treatment   Return instructions:  Please return to the Emergency Department if you do not get better, if you get worse, or new symptoms OR  - Fever (temperature greater than 101.46F)  - Bleeding that does not stop with holding pressure to the area    -Severe pain (please note that you may be more sore the day after your accident)  - Chest Pain  - Difficulty breathing  - Severe nausea or vomiting  - Inability to tolerate food and liquids  - Passing out  - Skin becoming red around your wounds  - Change in mental status (confusion or lethargy)  - New numbness or weakness    Please return if you have any other emergent concerns.  Additional Information:  Your vital signs today were: BP 131/82    Pulse 83    Temp 98.1 F (36.7 C) (Oral)    Resp 18    Ht  (1.727 m)    Wt 72.6 kg    LMP 03/18/2017 Comment: tubal ligation   SpO2 99%    BMI 24.33 kg/m  If your blood pressure (BP) was elevated above 135/85 this visit, please have this repeated by your doctor within one month. ---------------

## 2017-04-13 NOTE — ED Triage Notes (Signed)
PT presents to ED with c/o right lower back pain and painful urination.

## 2017-10-29 ENCOUNTER — Encounter (HOSPITAL_BASED_OUTPATIENT_CLINIC_OR_DEPARTMENT_OTHER): Payer: Self-pay

## 2017-10-29 ENCOUNTER — Emergency Department (HOSPITAL_BASED_OUTPATIENT_CLINIC_OR_DEPARTMENT_OTHER)
Admission: EM | Admit: 2017-10-29 | Discharge: 2017-10-29 | Disposition: A | Discharge status: Home / Self Care | Payer: Federal, State, Local not specified - PPO | Attending: Emergency Medicine | Admitting: Emergency Medicine

## 2017-10-29 ENCOUNTER — Other Ambulatory Visit: Payer: Self-pay

## 2017-10-29 DIAGNOSIS — Y93G1 Activity, food preparation and clean up: Secondary | ICD-10-CM | POA: Insufficient documentation

## 2017-10-29 DIAGNOSIS — Y998 Other external cause status: Secondary | ICD-10-CM | POA: Insufficient documentation

## 2017-10-29 DIAGNOSIS — Z87891 Personal history of nicotine dependence: Secondary | ICD-10-CM | POA: Diagnosis not present

## 2017-10-29 DIAGNOSIS — W260XXA Contact with knife, initial encounter: Secondary | ICD-10-CM | POA: Insufficient documentation

## 2017-10-29 DIAGNOSIS — S61210A Laceration without foreign body of right index finger without damage to nail, initial encounter: Secondary | ICD-10-CM | POA: Diagnosis present

## 2017-10-29 DIAGNOSIS — Y929 Unspecified place or not applicable: Secondary | ICD-10-CM | POA: Diagnosis not present

## 2017-10-29 NOTE — ED Notes (Signed)
ED Provider at bedside. 

## 2017-10-29 NOTE — ED Notes (Signed)
Pt lac cleaned with Safe-cleanse.

## 2017-10-29 NOTE — ED Provider Notes (Signed)
[facility] EMERGENCY DEPARTMENT Provider Note   CSN: [id number] Arrival date & time: 10/29/17  1846     History   Chief Complaint Chief Complaint  Patient presents with  . Extremity Laceration    HPI Emily Rogers is a 36 y.o. female.  Patient washing dishes when she cut her finger with a knife. Laceration just above the PIP of the right index finger.   The history is provided by the patient. No language interpreter was used.  Laceration   The incident occurred 1 to 2 hours ago. The laceration is located on the right hand. Size: 1.5 cm. The laceration mechanism was a a clean knife. The pain is mild. She reports no foreign bodies present. Her tetanus status is UTD.    Past Medical History:  Diagnosis Date  . Dental crowns present    x 3  . Injury of flexor tendon of left hand 09/2013   ring finger  . Nasal congestion 09/25/2013    Patient Active Problem List   Diagnosis Date Noted  . S/P C-section 05/05/2015  . Gestational hypertension w/o significant proteinuria in 3rd trimester 05/05/2015    Past Surgical History:  Procedure Laterality Date  . APPENDECTOMY  1988  . LAPAROSCOPIC LYSIS OF ADHESIONS  2004  . LAPAROSCOPIC OVARIAN CYSTECTOMY  2006   with lysis of adhesions    OB History    Gravida Para Term Preterm AB Living   1 1 1     1    SAB TAB Ectopic Multiple Live Births         0 1       Home Medications    Prior to Admission medications   Not on File    Family History No family history on file.  Social History Social History   Tobacco Use  . Smoking status: Former Games developermoker  . Smokeless tobacco: Never Used  Substance Use Topics  . Alcohol use: Yes    Comment: weekly  . Drug use: No     Allergies   Patient has no known allergies.   Review of Systems Review of Systems  Skin: Positive for wound.  All other systems reviewed and are negative.    Physical Exam Updated Vital Signs BP 130/81 (BP Location: Left Arm)    Pulse 73   Temp 98.2 F (36.8 C) (Oral)   Resp 18   Ht 5\' 8"  (1.727 m)   Wt 72.6 kg (160 lb)   LMP 10/28/2017   SpO2 98%   BMI 24.33 kg/m   Physical Exam  Constitutional: She appears well-developed and well-nourished. No distress.  HENT:  Head: Normocephalic and atraumatic.  Eyes: Conjunctivae are normal.  Neck: Neck supple.  Cardiovascular: Normal rate and regular rhythm.  No murmur heard. Pulmonary/Chest: Effort normal and breath sounds normal. No respiratory distress.  Abdominal: Soft. There is no tenderness.  Musculoskeletal: Normal range of motion. She exhibits no edema.       Right hand: She exhibits normal range of motion. Normal strength noted.       Hands: 1.5 cm laceration to the dorsal aspect of the right index finger just above the PIP. No difficulty with flexion or extension of finger at MCP, PIP, or DIP  Neurological: She is alert.  Skin: Skin is warm and dry.  Psychiatric: She has a normal mood and affect.  Nursing note and vitals reviewed.    ED Treatments / Results  Labs (all labs ordered are listed, but only abnormal results  are displayed) Labs Reviewed - No data to display  EKG  EKG Interpretation None       Radiology No results found.  Procedures .Marland Kitchen.Laceration Repair Date/Time: 10/29/2017 9:32 PM Performed by: Felicie MornSmith, Samari Bittinger, NP Authorized by: Felicie MornSmith, Avenir Lozinski, NP   Consent:    Consent obtained:  Verbal   Consent given by:  Patient   Risks discussed:  Poor cosmetic result Anesthesia (see MAR for exact dosages):    Anesthesia method:  Local infiltration   Local anesthetic:  Lidocaine 1% w/o epi Laceration details:    Location:  Hand   Hand location:  R hand, dorsum   Length (cm):  1.5 Repair type:    Repair type:  Simple Pre-procedure details:    Preparation:  Patient was prepped and draped in usual sterile fashion and imaging obtained to evaluate for foreign bodies Exploration:    Hemostasis achieved with:  Direct pressure   Wound  exploration: wound explored through full range of motion and entire depth of wound probed and visualized     Wound extent: no nerve damage noted and no tendon damage noted     Contaminated: no   Treatment:    Area cleansed with:  Betadine and saline   Amount of cleaning:  Standard   Irrigation solution:  Tap water   Irrigation method:  Tap Skin repair:    Repair method:  Sutures   Suture size:  4-0   Suture material:  Prolene   Number of sutures:  6 Approximation:    Approximation:  Close Post-procedure details:    Dressing:  Antibiotic ointment and sterile dressing   Patient tolerance of procedure:  Tolerated well, no immediate complications   (including critical care time)  Medications Ordered in ED Medications - No data to display   Initial Impression / Assessment and Plan / ED Course  I have reviewed the triage vital signs and the nursing notes.  Pertinent labs & imaging results that were available during my care of the patient were reviewed by me and considered in my medical decision making (see chart for details).     Tetanus UTD. Laceration occurred < 12 hours prior to repair. Discussed laceration care with pt and answered questions. Pt to f-u for suture removal in 7 days and wound check sooner should there be signs of dehiscence or infection. Pt is hemodynamically stable with no complaints prior to dc.    Final Clinical Impressions(s) / ED Diagnoses   Final diagnoses:  Laceration of right index finger without foreign body without damage to nail, initial encounter    ED Discharge Orders    None       Felicie MornSmith, Teliyah Royal, NP 10/29/17 2349    Rolland PorterJames, Mark, MD 11/03/17 [id number]

## 2017-10-29 NOTE — ED Triage Notes (Signed)
Pt cut right index finger with knife at home just PTA-lac noted across knuckle-gauze/kling applied

## 2017-10-29 NOTE — Discharge Instructions (Signed)
Suture removal in 7-10 days 

## 2019-12-21 ENCOUNTER — Other Ambulatory Visit: Payer: Self-pay

## 2019-12-21 ENCOUNTER — Ambulatory Visit: Payer: Federal, State, Local not specified - PPO | Attending: Internal Medicine

## 2019-12-21 DIAGNOSIS — Z20822 Contact with and (suspected) exposure to covid-19: Secondary | ICD-10-CM

## 2019-12-22 LAB — NOVEL CORONAVIRUS, NAA: SARS-CoV-2, NAA: NOT DETECTED

## 2020-01-06 ENCOUNTER — Other Ambulatory Visit: Payer: Self-pay | Admitting: Obstetrics

## 2020-01-06 DIAGNOSIS — R928 Other abnormal and inconclusive findings on diagnostic imaging of breast: Secondary | ICD-10-CM

## 2020-01-08 ENCOUNTER — Other Ambulatory Visit: Payer: Self-pay | Admitting: Obstetrics

## 2020-01-08 ENCOUNTER — Ambulatory Visit
Admission: RE | Admit: 2020-01-08 | Discharge: 2020-01-08 | Disposition: A | Discharge status: Home / Self Care | Payer: Federal, State, Local not specified - PPO | Source: Ambulatory Visit | Attending: Obstetrics | Admitting: Obstetrics

## 2020-01-08 ENCOUNTER — Other Ambulatory Visit: Payer: Self-pay

## 2020-01-08 DIAGNOSIS — R928 Other abnormal and inconclusive findings on diagnostic imaging of breast: Secondary | ICD-10-CM

## 2020-01-08 DIAGNOSIS — N6320 Unspecified lump in the left breast, unspecified quadrant: Secondary | ICD-10-CM

## 2020-07-08 ENCOUNTER — Other Ambulatory Visit: Payer: Self-pay

## 2020-07-08 ENCOUNTER — Other Ambulatory Visit: Payer: Self-pay | Admitting: Obstetrics

## 2020-07-08 ENCOUNTER — Ambulatory Visit
Admission: RE | Admit: 2020-07-08 | Discharge: 2020-07-08 | Disposition: A | Discharge status: Home / Self Care | Payer: Federal, State, Local not specified - PPO | Source: Ambulatory Visit | Attending: Obstetrics | Admitting: Obstetrics

## 2020-07-08 DIAGNOSIS — N6320 Unspecified lump in the left breast, unspecified quadrant: Secondary | ICD-10-CM

## 2021-01-09 ENCOUNTER — Other Ambulatory Visit: Payer: Self-pay | Admitting: Obstetrics

## 2021-01-09 ENCOUNTER — Ambulatory Visit
Admission: RE | Admit: 2021-01-09 | Discharge: 2021-01-09 | Disposition: A | Discharge status: Home / Self Care | Payer: Federal, State, Local not specified - PPO | Source: Ambulatory Visit | Attending: Obstetrics | Admitting: Obstetrics

## 2021-01-09 ENCOUNTER — Other Ambulatory Visit: Payer: Self-pay

## 2021-01-09 DIAGNOSIS — R928 Other abnormal and inconclusive findings on diagnostic imaging of breast: Secondary | ICD-10-CM

## 2021-01-09 DIAGNOSIS — N6320 Unspecified lump in the left breast, unspecified quadrant: Secondary | ICD-10-CM

## 2021-07-10 ENCOUNTER — Other Ambulatory Visit: Payer: Self-pay

## 2021-07-10 ENCOUNTER — Ambulatory Visit
Admission: RE | Admit: 2021-07-10 | Discharge: 2021-07-10 | Disposition: A | Discharge status: Home / Self Care | Payer: Federal, State, Local not specified - PPO | Source: Ambulatory Visit | Attending: Obstetrics | Admitting: Obstetrics

## 2021-07-10 ENCOUNTER — Other Ambulatory Visit: Payer: Self-pay | Admitting: Obstetrics

## 2021-07-10 DIAGNOSIS — R928 Other abnormal and inconclusive findings on diagnostic imaging of breast: Secondary | ICD-10-CM

## 2022-01-11 ENCOUNTER — Ambulatory Visit
Admission: RE | Admit: 2022-01-11 | Discharge: 2022-01-11 | Disposition: A | Discharge status: Home / Self Care | Payer: 59 | Source: Ambulatory Visit | Attending: Obstetrics | Admitting: Obstetrics

## 2022-01-11 DIAGNOSIS — R928 Other abnormal and inconclusive findings on diagnostic imaging of breast: Secondary | ICD-10-CM

## 2023-01-14 ENCOUNTER — Other Ambulatory Visit: Payer: Self-pay | Admitting: Obstetrics

## 2023-01-14 DIAGNOSIS — N63 Unspecified lump in unspecified breast: Secondary | ICD-10-CM

## 2023-02-20 ENCOUNTER — Other Ambulatory Visit: Payer: Self-pay

## 2023-02-20 ENCOUNTER — Emergency Department (HOSPITAL_BASED_OUTPATIENT_CLINIC_OR_DEPARTMENT_OTHER)
Admission: EM | Admit: 2023-02-20 | Discharge: 2023-02-20 | Disposition: A | Discharge status: Home / Self Care | Payer: Commercial Managed Care - PPO | Attending: Emergency Medicine | Admitting: Emergency Medicine

## 2023-02-20 ENCOUNTER — Encounter (HOSPITAL_BASED_OUTPATIENT_CLINIC_OR_DEPARTMENT_OTHER): Payer: Self-pay | Admitting: Pediatrics

## 2023-02-20 DIAGNOSIS — H1033 Unspecified acute conjunctivitis, bilateral: Secondary | ICD-10-CM | POA: Insufficient documentation

## 2023-02-20 DIAGNOSIS — H5789 Other specified disorders of eye and adnexa: Secondary | ICD-10-CM | POA: Diagnosis present

## 2023-02-20 MED ORDER — ERYTHROMYCIN 5 MG/GM OP OINT: TOPICAL_OINTMENT | OPHTHALMIC | 0 refills | Status: AC

## 2023-02-20 NOTE — ED Triage Notes (Signed)
C/O bilateral eye drainage and pain ; was seen by an optometrist and was prescribed some ABX. States she wants to make sure she did not develop an allergic reaction to the medication due to increase in pain and drainage.

## 2023-02-20 NOTE — ED Notes (Signed)
Pt states she saw eye dr today and was given ABX drops for her rt eye, Dx with pink eye, did 2 rounds of drops but states her eye felt more swollen and  had more drainage and she wanted to make sure she was noir allergic to the drops that were given, pt feels she has it other eye also

## 2023-02-20 NOTE — ED Provider Notes (Signed)
[facility] Provider Note   CSN: [id number] Arrival date & time: 02/20/23  1538     History  Chief Complaint  Patient presents with   Eye Drainage    Emily Rogers is a 42 y.o. female.  She denies any chronic medical problems she is not contact lens wearer.  She states she woke up with redness in her bilateral eyes today and went to her eye doctor.  They sent her home with prescription for gatifloxacin drops though they stated to take every 2 hours for the first 24 hours.  After taking the drops twice she noticed her eyes are significantly more red and she has some increased swelling of the conjunctival with "fluid buildup" under the surface of the eye.  Denies visual changes, denies pain, she does admit to drainage.   HPI     Home Medications Prior to Admission medications   Medication Sig Start Date End Date Taking? Authorizing Provider  erythromycin ophthalmic ointment Place a 1/2 inch ribbon of ointment into the lower eyelid 4-6 times daily for 7 days 02/20/23  Yes Amedeo Gory, Merlon Alcorta A, PA-C      Allergies    Patient has no known allergies.    Review of Systems   Review of Systems  Physical Exam Updated Vital Signs BP 138/82 (BP Location: Left Arm)   Pulse 80   Temp 98.2 F (36.8 C) (Oral)   Resp 18   Ht '5\' 8"'$  (1.727 m)   Wt 68 kg   SpO2 100%   BMI 22.81 kg/m  Physical Exam Vitals and nursing note reviewed.  Constitutional:      General: She is not in acute distress.    Appearance: She is well-developed.  HENT:     Head: Normocephalic and atraumatic.  Eyes:     General: Lids are normal.     Extraocular Movements: Extraocular movements intact.     Conjunctiva/sclera:     Right eye: Right conjunctiva is injected. Chemosis and exudate present. No hemorrhage.    Left eye: Left conjunctiva is injected. Chemosis and exudate present. No hemorrhage. Cardiovascular:     Rate and Rhythm: Normal rate and regular rhythm.      Heart sounds: No murmur heard. Pulmonary:     Effort: Pulmonary effort is normal. No respiratory distress.     Breath sounds: Normal breath sounds.  Abdominal:     Palpations: Abdomen is soft.     Tenderness: There is no abdominal tenderness.  Musculoskeletal:        General: No swelling.     Cervical back: Neck supple.  Skin:    General: Skin is warm and dry.     Capillary Refill: Capillary refill takes less than 2 seconds.  Neurological:     Mental Status: She is alert.  Psychiatric:        Mood and Affect: Mood normal.     ED Results / Procedures / Treatments   Labs (all labs ordered are listed, but only abnormal results are displayed) Labs Reviewed - No data to display  EKG None  Radiology No results found.  Procedures Procedures    Medications Ordered in ED Medications - No data to display  ED Course/ Medical Decision Making/ A&P                             Medical Decision Making Ddx: Conjunctivitis, scleritis, episcleritis, iritis, other ED  course: Patient was at her eye doctor today and had detailed exam was diagnosed conjunctivitis and given gatifloxacin drops which she feels is making it much worse and is worried she is allergic to the drops.  As she does not wear contact lenses discussed that we could try erythromycin which would be first-line treatment, she is aware of good handwashing and contact precautions as well.  Discussed that she should call her eye doctor tomorrow when they open discussed need for follow-up.  She is given strict return precautions.  Risk Prescription drug management.           Final Clinical Impression(s) / ED Diagnoses Final diagnoses:  Acute bacterial conjunctivitis of both eyes    Rx / DC Orders ED Discharge Orders          Ordered    erythromycin ophthalmic ointment        02/20/23 [address], PA-C 02/20/23 [place], [place], DO 02/20/23 1705

## 2023-02-20 NOTE — Discharge Instructions (Signed)
Seen today for an infection in your eyes.  Since the drops you are eye doctor gave you seem to make it somewhat worse we will prescribe erythromycin ointment.  Follow-up closely with your eye doctor, come back if you have visual changes, pain or other new or worsening symptoms.

## 2023-02-28 ENCOUNTER — Ambulatory Visit
Admission: RE | Admit: 2023-02-28 | Discharge: 2023-02-28 | Disposition: A | Discharge status: Home / Self Care | Payer: Commercial Managed Care - PPO | Source: Ambulatory Visit | Attending: Obstetrics | Admitting: Obstetrics

## 2023-02-28 ENCOUNTER — Other Ambulatory Visit: Payer: Self-pay | Admitting: Obstetrics

## 2023-02-28 DIAGNOSIS — N63 Unspecified lump in unspecified breast: Secondary | ICD-10-CM

## 2023-03-01 ENCOUNTER — Other Ambulatory Visit: Payer: Self-pay | Admitting: Obstetrics

## 2023-03-01 DIAGNOSIS — N63 Unspecified lump in unspecified breast: Secondary | ICD-10-CM

## 2023-09-08 IMAGING — [facility] MM  DIGITAL DIAGNOSTIC BREAST BILAT IMPLANT W/ TOMO W/ CAD
8 of 14 series · 8 of 34 positions shown · non-contrast
Comparison: Previous exam(s).

CLINICAL DATA: One year follow-up of right breast masses. Two year
follow-up of left breast masses.

EXAM:
DIGITAL DIAGNOSTIC BILATERAL MAMMOGRAM WITH IMPLANTS, CAD AND
TOMOSYNTHESIS; ULTRASOUND RIGHT BREAST LIMITED; ULTRASOUND LEFT
BREAST LIMITED
TECHNIQUE: Bilateral digital diagnostic mammography and breast tomosynthesis
was performed. The images were evaluated with computer-aided
detection. Standard and/or implant displaced views were performed.;
Targeted ultrasound examination of the right breast was performed;
Targeted ultrasound examination of the left breast was performed.

[R MLO]
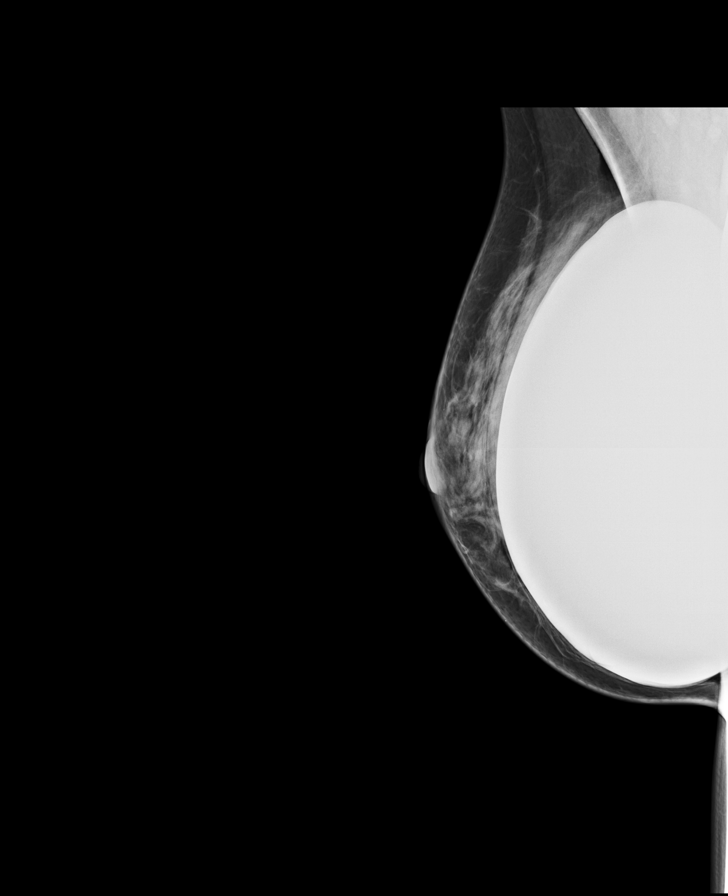

[R CC]
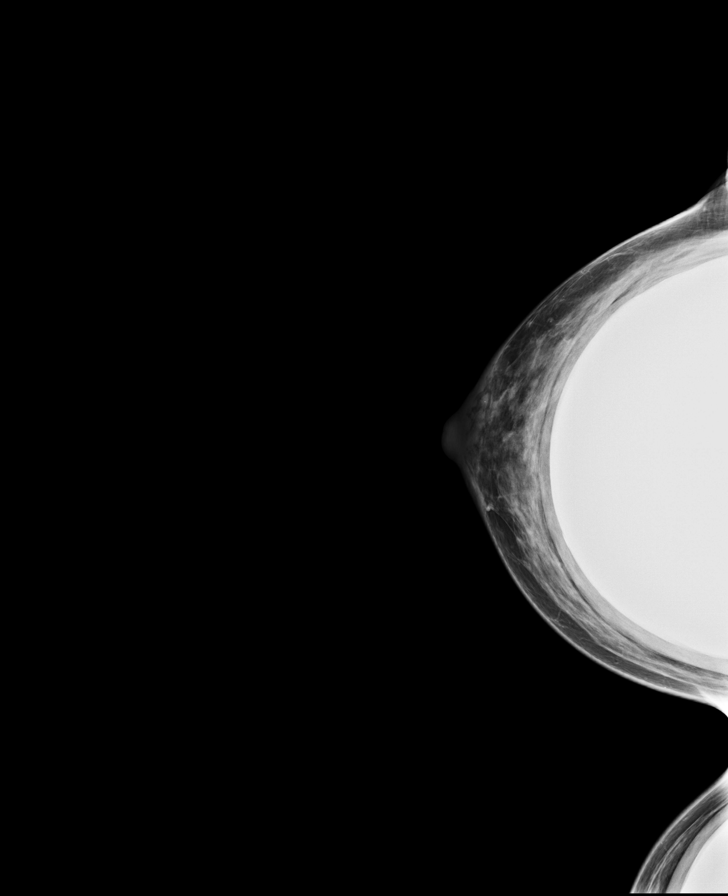

[L MLO]
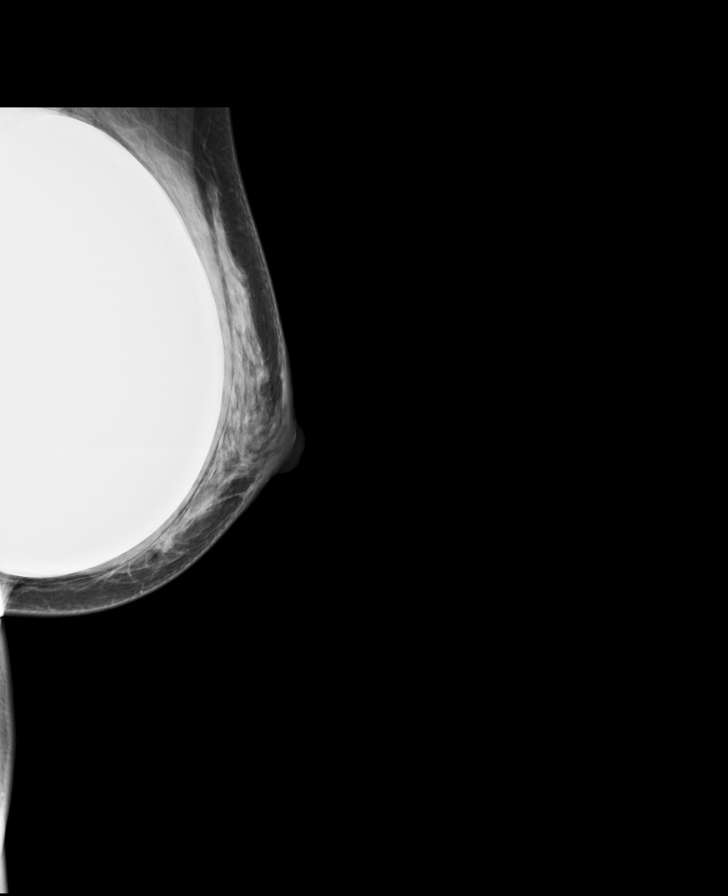

[L CC]
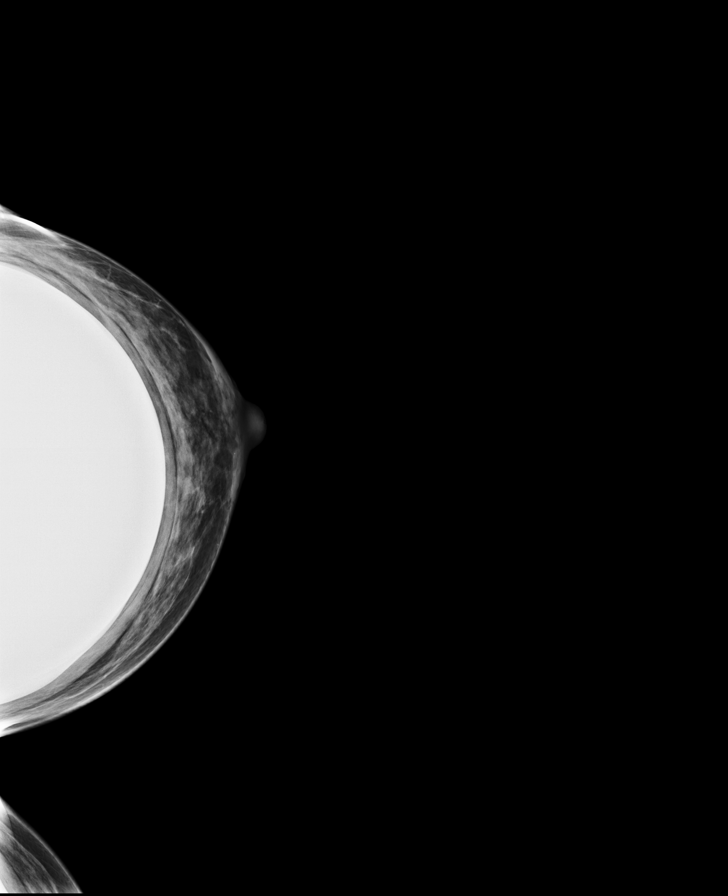

[R CC synth-2D]
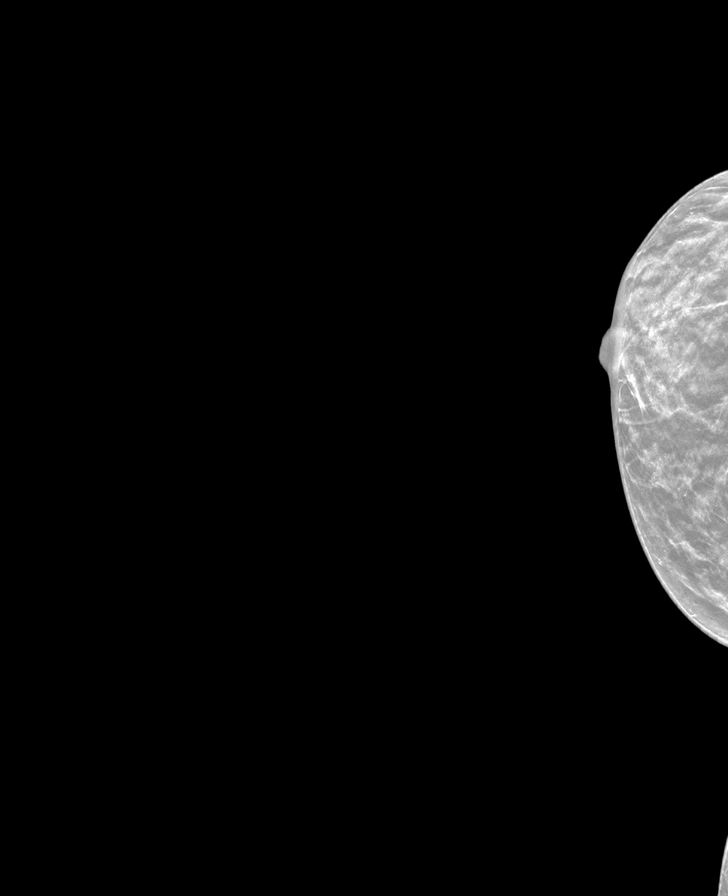

[L MLO synth-2D]
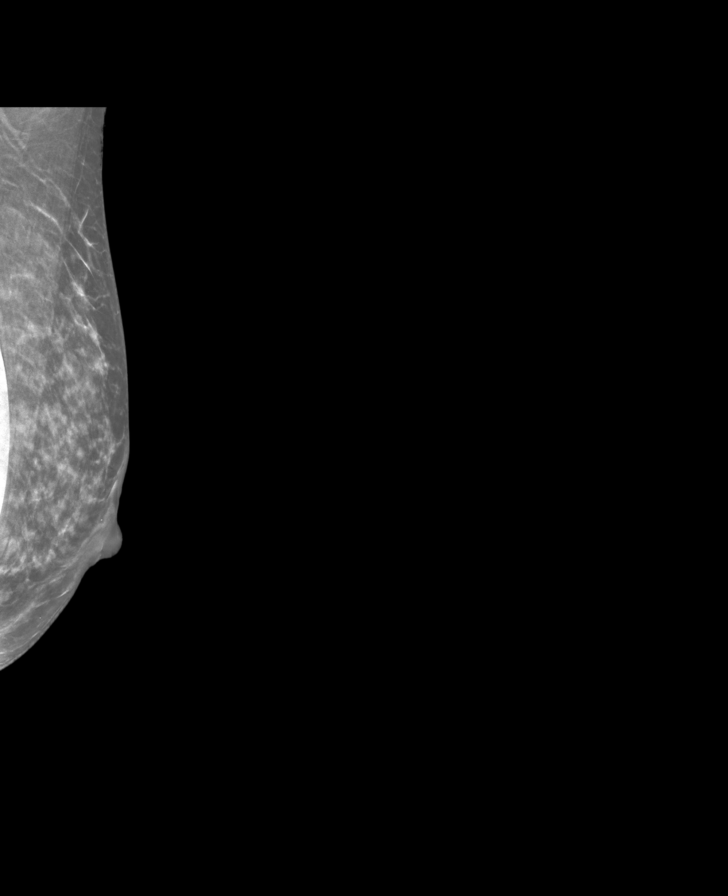

[L CC synth-2D (1 of 2)]
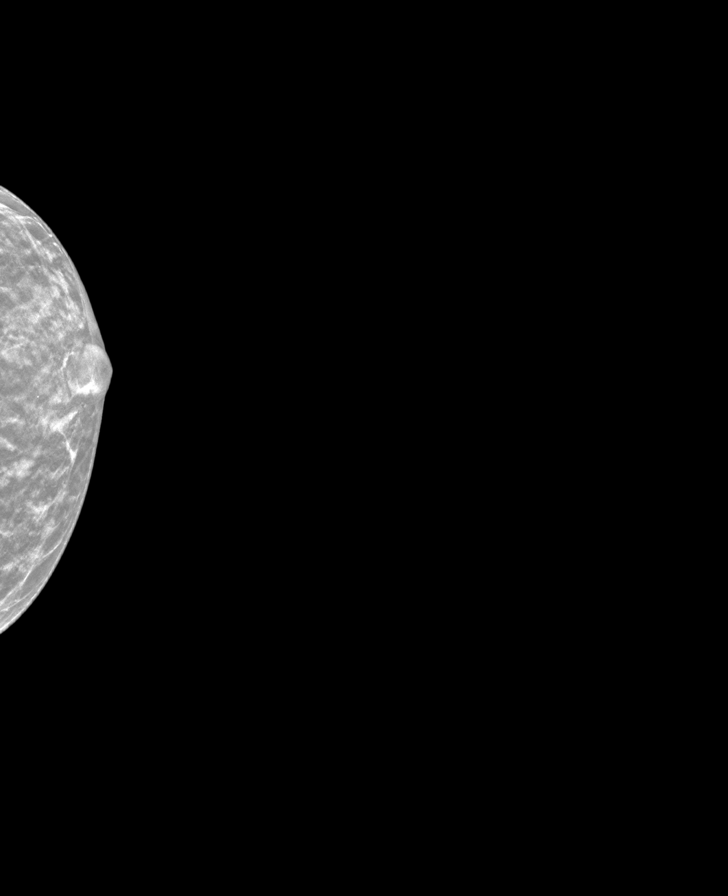

[L CC synth-2D (2 of 2)]
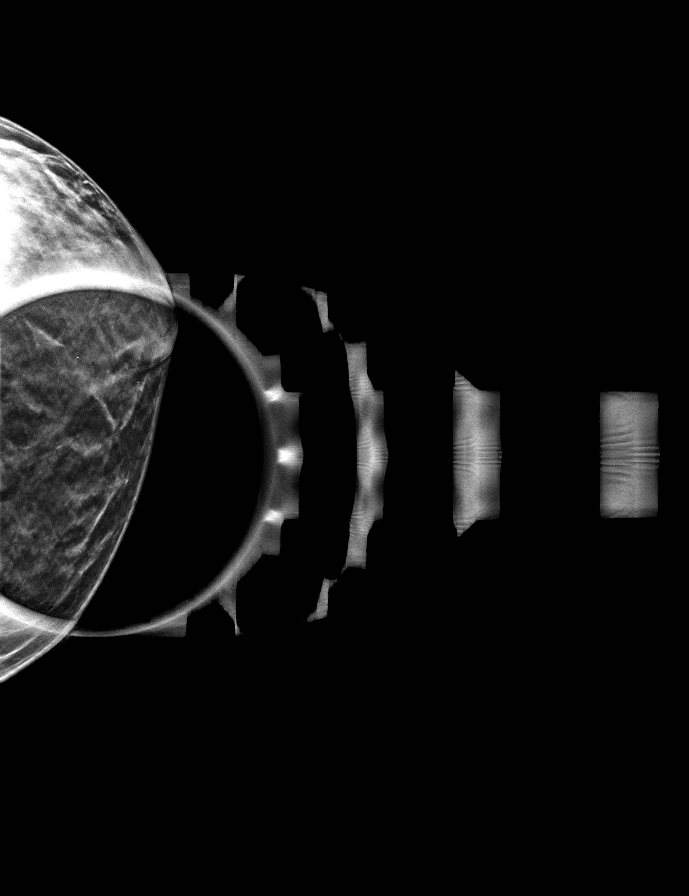

[8 of 34 positions shown; findings below may reference images not displayed]

ACR Breast Density Category c: The breast tissue is heterogeneously
dense, which may obscure small masses.
FINDINGS: The previously identified bilateral masses are less conspicuous on
today's mammography. A medial left breast asymmetry resolves on
additional imaging. No mammographic evidence of malignancy. The
patient has retropectoral implants.

On physical exam, no suspicious lumps are identified.

Targeted ultrasound is performed, showing 2 left breast masses at 9
o'clock, 4 cm from the nipple. The first measures 3 x 6 x 7 mm today
versus 3 x 6 x 7 mm in Tuesday June, 2020. The second measures 4 x 2 x 6
mm today versus 5 x 3 by 6 mm previously.

There also 2 masses in the right breast. The first is identified at
12 o'clock, 6 cm from the nipple measuring 7 by 6 x 4 mm today
versus 8 by 5 x 6 mm in December 1998 22. The second is identified at
12 o'clock, 3 cm from the nipple measuring 6 x 4 x 5 mm today versus
5 x 4 x 5 mm previously.
IMPRESSION: The left sided masses have been stable for 2 years requiring no
additional follow-up. The right-sided masses are stable for 1 year.
No other suspicious findings.

RECOMMENDATION:
Recommend 12 month follow-up ultrasound of the probably benign right
breast masses. The patient will be due for bilateral mammography at
that time.

I have discussed the findings and recommendations with the patient.
If applicable, a reminder letter will be sent to the patient
regarding the next appointment.

BI-RADS CATEGORY  3: Probably benign.

## 2024-10-28 ENCOUNTER — Other Ambulatory Visit: Payer: Self-pay | Admitting: Obstetrics

## 2024-10-28 DIAGNOSIS — Z1231 Encounter for screening mammogram for malignant neoplasm of breast: Secondary | ICD-10-CM

## 2024-10-29 ENCOUNTER — Ambulatory Visit
Admission: RE | Admit: 2024-10-29 | Discharge: 2024-10-29 | Disposition: A | Source: Ambulatory Visit | Attending: Family Medicine | Admitting: Family Medicine

## 2024-10-29 DIAGNOSIS — Z1231 Encounter for screening mammogram for malignant neoplasm of breast: Secondary | ICD-10-CM
